# Patient Record
Sex: Male | Born: 1985 | Race: White | Hispanic: No | Marital: Married | Smoking: Former smoker
Health system: Southern US, Community
[De-identification: ages and names within clinical notes are randomized; demographics above are authoritative.]

## PROBLEM LIST (undated history)

## (undated) DIAGNOSIS — S62009A Unspecified fracture of navicular [scaphoid] bone of unspecified wrist, initial encounter for closed fracture: Secondary | ICD-10-CM

## (undated) HISTORY — PX: WISDOM TOOTH EXTRACTION: SHX21

## (undated) HISTORY — PX: DECOMPRESSION FASCIOTOMY LEG: SUR403

---

## 2008-09-11 ENCOUNTER — Encounter: Admission: RE | Admit: 2008-09-11 | Discharge: 2008-09-11 | Payer: Self-pay | Admitting: Orthopedic Surgery

## 2009-05-12 ENCOUNTER — Emergency Department (HOSPITAL_COMMUNITY): Admission: EM | Admit: 2009-05-12 | Discharge: 2009-05-12 | Payer: Self-pay | Admitting: Emergency Medicine

## 2009-05-19 ENCOUNTER — Ambulatory Visit (HOSPITAL_BASED_OUTPATIENT_CLINIC_OR_DEPARTMENT_OTHER): Admission: RE | Admit: 2009-05-19 | Discharge: 2009-05-19 | Payer: Self-pay | Admitting: Orthopedic Surgery

## 2009-05-19 HISTORY — PX: MEDIAN NERVE REPAIR: SHX2024

## 2009-05-19 HISTORY — PX: WOUND DEBRIDEMENT: SHX247

## 2010-09-17 ENCOUNTER — Encounter: Payer: Self-pay | Admitting: Orthopedic Surgery

## 2010-12-01 LAB — TYPE AND SCREEN

## 2011-12-24 ENCOUNTER — Emergency Department (HOSPITAL_COMMUNITY): Payer: PPO

## 2011-12-24 ENCOUNTER — Emergency Department (HOSPITAL_COMMUNITY)
Admission: EM | Admit: 2011-12-24 | Discharge: 2011-12-24 | Disposition: A | Discharge status: Home / Self Care | Payer: PPO | Attending: Emergency Medicine | Admitting: Emergency Medicine

## 2011-12-24 ENCOUNTER — Encounter (HOSPITAL_COMMUNITY): Payer: Self-pay

## 2011-12-24 DIAGNOSIS — Y93B3 Activity, free weights: Secondary | ICD-10-CM | POA: Insufficient documentation

## 2011-12-24 DIAGNOSIS — M5126 Other intervertebral disc displacement, lumbar region: Secondary | ICD-10-CM | POA: Insufficient documentation

## 2011-12-24 DIAGNOSIS — X503XXA Overexertion from repetitive movements, initial encounter: Secondary | ICD-10-CM | POA: Insufficient documentation

## 2011-12-24 MED ORDER — IBUPROFEN 600 MG PO TABS: 600.0000 mg | ORAL_TABLET | Freq: Four times a day (QID) | ORAL | Status: AC | PRN

## 2011-12-24 MED ORDER — MORPHINE SULFATE 4 MG/ML IJ SOLN
4.0000 mg | Freq: Once | INTRAMUSCULAR | Status: AC
  Administered 2011-12-24: 4 mg via INTRAMUSCULAR
  Filled 2011-12-24: qty 1

## 2011-12-24 MED ORDER — OXYCODONE-ACETAMINOPHEN 5-325 MG PO TABS: 1.0000 | ORAL_TABLET | ORAL | Status: AC | PRN

## 2011-12-24 MED ORDER — METHOCARBAMOL 500 MG PO TABS: 500.0000 mg | ORAL_TABLET | Freq: Two times a day (BID) | ORAL | Status: AC

## 2011-12-24 MED ORDER — METHOCARBAMOL 500 MG PO TABS
1000.0000 mg | ORAL_TABLET | Freq: Once | ORAL | Status: AC
  Administered 2011-12-24: 1000 mg via ORAL
  Filled 2011-12-24: qty 2

## 2011-12-24 MED ORDER — KETOROLAC TROMETHAMINE 60 MG/2ML IM SOLN
60.0000 mg | Freq: Once | INTRAMUSCULAR | Status: AC
  Administered 2011-12-24: 60 mg via INTRAMUSCULAR
  Filled 2011-12-24: qty 2

## 2011-12-24 NOTE — ED Notes (Signed)
Pt sts was lifitng weights and felt a pop in back and became light headed. Pt unable to sit still now or in comfortable position.

## 2011-12-24 NOTE — ED Notes (Signed)
Patient reporting pain to right left lower flank after lifting weights 2 hours ago. Patient ambulatory in department but with difficulty.  Patient laying laterally on stretcher. Skin warm, dry and intact, no deformities noted

## 2011-12-24 NOTE — ED Provider Notes (Signed)
History     CSN: [id number]  Arrival date & time 12/24/11  1745   First MD Initiated Contact with Patient 12/24/11 1845      Chief Complaint  Patient presents with  . Back Pain    (Consider location/radiation/quality/duration/timing/severity/associated sxs/prior treatment) HPI Pt with known lumbar disc disease states he was lifting weights when had acute onset of R paraspinal lumbar pain radiating to R buttock. No numbness, weakness, incontinence.  No past medical history on file.  No past surgical history on file.  No family history on file.  History  Substance Use Topics  . Smoking status: Current Everyday Smoker  . Smokeless tobacco: Not on file  . Alcohol Use: No      Review of Systems  Constitutional: Negative for fever and chills.  Musculoskeletal: Positive for back pain.  Skin: Negative for wound.  Neurological: Negative for weakness and numbness.    Allergies  Review of patient's allergies indicates no known allergies.  Home Medications   Current Outpatient Rx  Name Route Sig Dispense Refill  . IBUPROFEN 600 MG PO TABS Oral Take 1 tablet (600 mg total) by mouth every 6 (six) hours as needed for pain. 30 tablet 0  . METHOCARBAMOL 500 MG PO TABS Oral Take 1 tablet (500 mg total) by mouth 2 (two) times daily. 20 tablet 0  . OXYCODONE-ACETAMINOPHEN 5-325 MG PO TABS Oral Take 1 tablet by mouth every 4 (four) hours as needed for pain. 15 tablet 0    BP 112/95  Pulse 95  Temp(Src) 98.6 F (37 C) (Oral)  Resp 18  SpO2 97%  Physical Exam  Nursing note and vitals reviewed. Constitutional: He is oriented to person, place, and time. He appears well-developed and well-nourished. He appears distressed.  HENT:  Head: Normocephalic and atraumatic.  Mouth/Throat: Oropharynx is clear and moist.  Eyes: EOM are normal. Pupils are equal, round, and reactive to light.  Neck: Normal range of motion. Neck supple.  Cardiovascular: Normal rate and regular rhythm.     Pulmonary/Chest: Effort normal and breath sounds normal. No respiratory distress. He has no wheezes. He has no rales.  Abdominal: Soft. Bowel sounds are normal. There is no tenderness. There is no rebound and no guarding.  Musculoskeletal: Normal range of motion. He exhibits tenderness (R paraspinal lumbar ttp. Negative straight leg raise. ). He exhibits no edema.  Neurological: He is alert and oriented to person, place, and time.       5/5 motor, sensation intact. No saddle anesthesia  Skin: Skin is warm and dry. No rash noted. No erythema.       No evidence of trauma  Psychiatric: He has a normal mood and affect. His behavior is normal.    ED Course  Procedures (including critical care time)  Labs Reviewed - No data to display Ct Lumbar Spine Wo Contrast  12/24/2011  *RADIOLOGY REPORT*  Clinical Data: Back injury while lifting weights.  Back pain.  CT LUMBAR SPINE WITHOUT CONTRAST  Technique:  Multidetector CT imaging of the lumbar spine was performed without intravenous contrast administration. Multiplanar CT image reconstructions were also generated.  Comparison: Lumbar spine MRI from 09/01/2008  Findings: No fracture or subluxation is observed.  Disc bulge at L4- 5 and right eccentric degenerative disc disease at L5-S1 are again noted, grossly similar to the prior MRI. There is fullness of the collecting systems bilaterally, but without observed hydroureter. A small lucency in the right iliac bone is probably incidental.  IMPRESSION:  1.  Disc bulge at L4-5 and right eccentric disc bulge or disc protrusion at L5-S1 appear grossly similar to the prior exam. 2.  No fracture or subluxation is observed.  Original Report Authenticated By: Dellia Cloud, M.D.     1. Lumbar herniated disc       MDM   Pt states he is feeling better after meds       Loren Racer, MD 12/24/11 2019

## 2011-12-24 NOTE — ED Notes (Signed)
TRANSPORTED TO CT SCAN.  

## 2011-12-24 NOTE — Discharge Instructions (Signed)
Herniated Disk The bones of your spinal column (vertebrae) protect your spinal cord and nerves that go into your arms and legs. The vertebrae are separated by disks that cushion the spinal column and put space between your vertebrae. This allows movement between the vertebrae, which allows you to bend, rotate, and move your body from side to side. Sometimes, the disks move out of place (herniate) or break open (rupture) from injury or strain. The most common area for a disk herniation is in the lower back (lumbar area). Sometimes herniation occurs in the neck (cervical) disks.  CAUSES  As we grow older, the strong, fibrous cords that connect the vertebrae and support and surround the disks (ligaments) start to weaken. A strain on the back may cause a break in the disk ligaments. RISK FACTORS Herniated disks occur most often in men who are aged 18 years to 35 years, usually after strenuous activity. Other risk factors include conditions present at birth (congenital) that affect the size of the lumbar spinal canal. Additionally, a narrowing of the areas where the nerves exit the spinal canal can occur as you age. SYMPTOMS  Symptoms of a herniated disk vary. You may have weakness in certain muscles. This weakness can include difficulty lifting your leg or arm, difficulty standing on your toes on one side, or difficulty squeezing tightly with one of your hands. You may have numbness. You may feel a mild tingling, dull ache, or a burning or pulsating pain. In some cases, the pain is severe enough that you are unable to move. The pain most often occurs on one side of the body. The pain often starts slowly. It may get worse:  After you sit or stand.   At night.   When you sneeze, cough, or laugh.   When you bend backwards or walk more than a few yards.  The pain, numbness, or weakness will often go away or improve a lot over a period of weeks to months. Herniated lumbar disk Symptoms of a herniated  lumbar disk may include sharp pain in one part of your leg, hip, or buttocks and numbness in other parts. You also may feel pain or numbness on the back of your calf or the top or sole of your foot. The same leg also may feel weak. Herniated cervical disk Symptoms of a herniated cervical disk may include pain when you move your neck, deep pain near or over your shoulder blade, or pain that moves to your upper arm, forearm, or fingers. DIAGNOSIS  To diagnose a herniated disk, your caregiver will perform a physical exam. Your caregiver also may perform diagnostic tests to see your disk or to test the reaction of your muscles and the function of your nerves. During the physical exam, your caregiver may ask you to:  Sit, stand, and walk. While you walk, your caregiver may ask you to try walking on your toes and then your heels.   Bend forward, backward, and sideways.   Raise your shoulders, elbow, wrist, and fingers and check your strength during these tasks.  Your caregiver will check for:  Numbness or loss of feeling.   Muscle reflexes, which may be slower or missing.   Muscle strength, which may be weaker.   Posture or the way your spine curves.  Diagnostic tests that may be done include:  A spinal X-ray exam to rule out other causes of back pain.   Magnetic resonance imaging (MRI) or computed tomography (CT) scan, which will show   if the herniated disk is pressing on your spinal canal.   Electromyography. This is sometimes used to identify the specific area of nerve involvement.  TREATMENT  Initial treatment for a herniated disk is a short period of rest with medicines for pain. Pain medicines can include nonsteroidal anti-inflammatory medicines (NSAIDs), muscle relaxants for back spasms, and (rarely) narcotic pain medicine for severe pain that does not respond to NSAID use. Bed rest is often limited to 1 or 2 days at the most because prolonged rest can delay recovery. When the herniation  involves the lower back, sitting should be avoided as much as possible because sitting increases pressure on the ruptured disk. Sometimes a soft neck collar will be prescribed for a few days to weeks to help support your neck in the case of a cervical herniation. Physical therapy is often prescribed for patients with disk disease. Physical therapists will teach you how to properly lift, dress, walk, and perform other activities. They will work on strengthening the muscles that help support your spine. In some cases, physical therapy alone is not enough to treat a herniated disk. Steroid injections along the involved nerve root may be needed to help control pain. The steroid is injected in the area of the herniated disk and helps by reducing swelling around the disk. Sometimes surgery is the best option to treat a herniated disk.  SEEK IMMEDIATE MEDICAL CARE IF:   You have numbness, tingling, weakness, or problems with the use of your arms or legs.   You have severe headaches that are not relieved with the use of medicines.   You notice a change in your bowel or bladder control.   You have increasing pain in any areas of your body.   You experience shortness of breath, dizziness, or fainting.  MAKE SURE YOU:   Understand these instructions.   Will watch your condition.   Will get help right away if you are not doing well or get worse.  Document Released: 08/10/2000 Document Revised: 08/02/2011 Document Reviewed: 03/16/2011 [employer] Patient Information 2012 [employer]. 

## 2013-07-27 DIAGNOSIS — S62009A Unspecified fracture of navicular [scaphoid] bone of unspecified wrist, initial encounter for closed fracture: Secondary | ICD-10-CM

## 2013-07-27 HISTORY — DX: Unspecified fracture of navicular (scaphoid) bone of unspecified wrist, initial encounter for closed fracture: S62.009A

## 2013-08-18 ENCOUNTER — Other Ambulatory Visit: Payer: Self-pay | Admitting: Orthopedic Surgery

## 2013-08-26 ENCOUNTER — Encounter (HOSPITAL_BASED_OUTPATIENT_CLINIC_OR_DEPARTMENT_OTHER): Payer: Self-pay | Admitting: *Deleted

## 2013-09-02 NOTE — H&P (Signed)
Jason Pittman is an 28 y.o. male.   Chief Complaint: c/o chronic and progressive pain right wrist.  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  We cared for Jason Pittman for a complex high brachial injury in September of 2010 reconstructing his median nerve. He had a tense brachial hematoma from a large caliber axillary vein.   During his care in 2010 we noted a right scaphoid nonunion. We advised him at the time to consider a possible graft and internal fixation. As he had good motion and no significant pain, he elected to postpone treatment at that time. Recently he developed increasing pain in his wrist. [facility] sent him for a consult with Dr. Leonette Mostouhy at [facility], an upper extremity orthopaedic surgeon. Dr. Leonette Mostouhy obtained plain film imaging demonstrating a cystic nonunion that has evolved since 2010 with some mild carpal collapse and mild displacement. There is minimal radial styloid arthrosis but the scaphoid is clearly compacting. Dr. Leonette Mostouhy obtained an [facility] on 07-02-13. 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 He subsequently suggested a CT scan.  Past Medical History  Diagnosis Date  . Scaphoid fracture of wrist 07/2013    right    Past Surgical History  Procedure Laterality Date  . Wisdom tooth extraction    . Wound debridement Left 05/19/2009    left brachial  . Median nerve repair Left 05/19/2009  . Decompression fasciotomy leg Bilateral     as a teenager    History reviewed. No pertinent family history. Social History:  reports that  he has quit smoking. He has never used smokeless tobacco. He reports that he drinks alcohol. He reports that he does not use illicit drugs.  Allergies: No Known Allergies  No prescriptions prior to admission    No results found for this or any previous visit (from the past 48 hour(s)).  No results found.   Pertinent items are noted in HPI.  Height 5\' 9"  (1.753 m), weight 81.647 kg (180 lb).  General appearance: alert Head: Normocephalic, without obvious abnormality Neck: supple, symmetrical, trachea midline Resp: clear to auscultation bilaterally Cardio: regular rate and rhythm GI: normal findings: bowel sounds normal Extremities: . Inspection of his right hand reveals complete recovery of his median innervated intrinsic muscles. He has 5 out of 5 strength in all median innervated muscle groups tested which is remarkable following a high median nerve repair. He still has distant sensibility in his finger tips. We discussed the issues with sensory reinnervation with a high injury.   With respect to his wrist he has ROM dorsiflexion right 75, left 75, palmar flexion right 35 with pain and left 65. He has pain with radial deviation. He has full ROM of his fingers and thumb. His neurovascular exam is intact.  [facility] are reviewed dated 04-23-13. 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 He is in a 40 degree DISI carpal collapse position.  The MRI is reviewed and reveals an  avascular proximal half of the scaphoid with a large cyst. The good news is that he does not have a substantial collapse of his scaphoid despite comminution.  He is in a 40 degree DISI carpal intercalated instability.  The distal portion of the scaphoid is fragmenting. He does have a small cyst within the lunate. He is ulnar neutral. His TFC appears intact.   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  There is some fragmentation of the distal segment of the scaphoid. Based on the MRI appearance of the scaphoid with an apparent avascular proximal half the prognosis for healing of this fracture is extremely uncertain. I've explained to Jason Pittman that in this situation his success or failure will be either 100% or 0%.  In general, the care of scaphoid fractures, as he has, with a large cystic nonunion and avascular proximal pole, have about 50% success rate with bone grafting. Some studies would suggest that vascularized bone grafting would increase the chance of healing however he would likely remain with a very stiff wrist with a vascular pedicle being placed across the dorsum of his carpus and radiocarpal articulation.  It is very important for Jason Pittman to be able to workout in the gym regularly.  Plan: To the OR for right distal radial +/- vascularized bone graft  from radius to scaphoid versus conventional bone grafting with either K-wire or micro Accutrak fixation.The procedure, risks,benefits and post-op course were discussed with the patient at length and they were in agreement with the plan.  DASNOIT,Raye J 09/02/2013, 11:25 AM  H&P documentation: 09/03/2013  -History and Physical Reviewed  -Patient has been re-examined  -No change in the plan of care  Wyn Forster, MD

## 2013-09-03 ENCOUNTER — Ambulatory Visit (HOSPITAL_BASED_OUTPATIENT_CLINIC_OR_DEPARTMENT_OTHER)
Admission: RE | Admit: 2013-09-03 | Discharge: 2013-09-03 | Disposition: A | Discharge status: Home / Self Care | Payer: Managed Care, Other (non HMO) | Source: Ambulatory Visit | Attending: Orthopedic Surgery | Admitting: Orthopedic Surgery

## 2013-09-03 ENCOUNTER — Encounter (HOSPITAL_BASED_OUTPATIENT_CLINIC_OR_DEPARTMENT_OTHER)
Admission: RE | Disposition: A | Discharge status: Home / Self Care | Payer: Self-pay | Source: Ambulatory Visit | Attending: Orthopedic Surgery

## 2013-09-03 ENCOUNTER — Ambulatory Visit (HOSPITAL_BASED_OUTPATIENT_CLINIC_OR_DEPARTMENT_OTHER): Payer: Managed Care, Other (non HMO) | Admitting: Anesthesiology

## 2013-09-03 ENCOUNTER — Encounter (HOSPITAL_BASED_OUTPATIENT_CLINIC_OR_DEPARTMENT_OTHER): Payer: Managed Care, Other (non HMO) | Admitting: Anesthesiology

## 2013-09-03 ENCOUNTER — Encounter (HOSPITAL_BASED_OUTPATIENT_CLINIC_OR_DEPARTMENT_OTHER): Payer: Self-pay

## 2013-09-03 DIAGNOSIS — S42309S Unspecified fracture of shaft of humerus, unspecified arm, sequela: Secondary | ICD-10-CM | POA: Insufficient documentation

## 2013-09-03 DIAGNOSIS — M8708 Idiopathic aseptic necrosis of bone, other site: Secondary | ICD-10-CM | POA: Insufficient documentation

## 2013-09-03 DIAGNOSIS — IMO0002 Reserved for concepts with insufficient information to code with codable children: Secondary | ICD-10-CM | POA: Insufficient documentation

## 2013-09-03 DIAGNOSIS — Z87891 Personal history of nicotine dependence: Secondary | ICD-10-CM | POA: Insufficient documentation

## 2013-09-03 HISTORY — PX: OPEN REDUCTION INTERNAL FIXATION (ORIF) SCAPHOID WITH DISTAL RADIUS GRAFT: SHX5667

## 2013-09-03 HISTORY — DX: Unspecified fracture of navicular (scaphoid) bone of unspecified wrist, initial encounter for closed fracture: S62.009A

## 2013-09-03 LAB — POCT HEMOGLOBIN-HEMACUE: Hemoglobin: 16.2 g/dL (ref 13.0–17.0)

## 2013-09-03 SURGERY — OPEN REDUCTION INTERNAL FIXATION (ORIF) SCAPHOID WITH DISTAL RADIUS GRAFT
Anesthesia: General | Site: Wrist | Laterality: Right

## 2013-09-03 MED ORDER — FENTANYL CITRATE 0.05 MG/ML IJ SOLN
INTRAMUSCULAR | Status: AC
  Filled 2013-09-03: qty 2

## 2013-09-03 MED ORDER — DEXAMETHASONE SODIUM PHOSPHATE 4 MG/ML IJ SOLN
INTRAMUSCULAR | Status: DC | PRN
  Administered 2013-09-03: 10 mg via INTRAVENOUS

## 2013-09-03 MED ORDER — LIDOCAINE HCL (CARDIAC) 20 MG/ML IV SOLN
INTRAVENOUS | Status: DC | PRN
  Administered 2013-09-03: 80 mg via INTRAVENOUS

## 2013-09-03 MED ORDER — PROPOFOL 10 MG/ML IV BOLUS
INTRAVENOUS | Status: DC | PRN
  Administered 2013-09-03: 200 mg via INTRAVENOUS

## 2013-09-03 MED ORDER — HYDROMORPHONE HCL 2 MG PO TABS: ORAL_TABLET | ORAL | Status: DC

## 2013-09-03 MED ORDER — MIDAZOLAM HCL 2 MG/2ML IJ SOLN
INTRAMUSCULAR | Status: AC
  Filled 2013-09-03: qty 2

## 2013-09-03 MED ORDER — ROPIVACAINE HCL 5 MG/ML IJ SOLN
INTRAMUSCULAR | Status: DC | PRN
  Administered 2013-09-03: 30 mL via PERINEURAL

## 2013-09-03 MED ORDER — MIDAZOLAM HCL 5 MG/5ML IJ SOLN
INTRAMUSCULAR | Status: DC | PRN
  Administered 2013-09-03: 2 mg via INTRAVENOUS

## 2013-09-03 MED ORDER — PROMETHAZINE HCL 25 MG/ML IJ SOLN
6.2500 mg | INTRAMUSCULAR | Status: DC | PRN
  Administered 2013-09-03: 6.25 mg via INTRAVENOUS
  Filled 2013-09-03: qty 1

## 2013-09-03 MED ORDER — CHLORHEXIDINE GLUCONATE 4 % EX LIQD: 60.0000 mL | Freq: Once | CUTANEOUS | Status: DC

## 2013-09-03 MED ORDER — HYDROMORPHONE HCL PF 1 MG/ML IJ SOLN: 0.2500 mg | INTRAMUSCULAR | Status: DC | PRN

## 2013-09-03 MED ORDER — MIDAZOLAM HCL 2 MG/2ML IJ SOLN
1.0000 mg | INTRAMUSCULAR | Status: DC | PRN
  Administered 2013-09-03: 2 mg via INTRAVENOUS

## 2013-09-03 MED ORDER — MIDAZOLAM HCL 2 MG/ML PO SYRP: 12.0000 mg | ORAL_SOLUTION | Freq: Once | ORAL | Status: DC | PRN

## 2013-09-03 MED ORDER — FENTANYL CITRATE 0.05 MG/ML IJ SOLN
50.0000 ug | INTRAMUSCULAR | Status: DC | PRN
  Administered 2013-09-03: 100 ug via INTRAVENOUS

## 2013-09-03 MED ORDER — CEPHALEXIN 500 MG PO CAPS: 500.0000 mg | ORAL_CAPSULE | Freq: Three times a day (TID) | ORAL | Status: DC

## 2013-09-03 MED ORDER — LACTATED RINGERS IV SOLN
INTRAVENOUS | Status: DC
  Administered 2013-09-03: 12:00:00 via INTRAVENOUS
  Administered 2013-09-03: 20 mL/h via INTRAVENOUS
  Administered 2013-09-03: 14:00:00 via INTRAVENOUS

## 2013-09-03 MED ORDER — OXYCODONE HCL 5 MG PO TABS: 5.0000 mg | ORAL_TABLET | Freq: Once | ORAL | Status: DC | PRN

## 2013-09-03 MED ORDER — OXYCODONE HCL 5 MG/5ML PO SOLN: 5.0000 mg | Freq: Once | ORAL | Status: DC | PRN

## 2013-09-03 MED ORDER — ONDANSETRON HCL 4 MG/2ML IJ SOLN
INTRAMUSCULAR | Status: DC | PRN
  Administered 2013-09-03: 4 mg via INTRAVENOUS

## 2013-09-03 SURGICAL SUPPLY — 66 items
BAG DECANTER FOR FLEXI CONT (MISCELLANEOUS) IMPLANT
BANDAGE ADH SHEER 1  50/CT (GAUZE/BANDAGES/DRESSINGS) IMPLANT
BANDAGE ELASTIC 3 VELCRO ST LF (GAUZE/BANDAGES/DRESSINGS) IMPLANT
BLADE AVERAGE 25MMX9MM (BLADE)
BLADE AVERAGE 25X9 (BLADE) IMPLANT
BLADE MINI RND TIP GREEN BEAV (BLADE) ×3 IMPLANT
BLADE SURG 15 STRL LF DISP TIS (BLADE) ×1 IMPLANT
BLADE SURG 15 STRL SS (BLADE) ×2
BNDG COHESIVE 3X5 TAN STRL LF (GAUZE/BANDAGES/DRESSINGS) IMPLANT
BNDG ESMARK 4X9 LF (GAUZE/BANDAGES/DRESSINGS) ×3 IMPLANT
BNDG GAUZE ELAST 4 BULKY (GAUZE/BANDAGES/DRESSINGS) ×3 IMPLANT
BONE CHIP PRESERV 5CC PCAN5 (Bone Implant) ×3 IMPLANT
BRUSH SCRUB EZ PLAIN DRY (MISCELLANEOUS) ×3 IMPLANT
CANISTER SUCT 1200ML W/VALVE (MISCELLANEOUS) ×3 IMPLANT
CLOSURE WOUND 1/2 X4 (GAUZE/BANDAGES/DRESSINGS) ×1
CORDS BIPOLAR (ELECTRODE) ×3 IMPLANT
COVER MAYO STAND STRL (DRAPES) ×3 IMPLANT
COVER TABLE BACK 60X90 (DRAPES) ×3 IMPLANT
CUFF TOURNIQUET SINGLE 18IN (TOURNIQUET CUFF) ×3 IMPLANT
DECANTER SPIKE VIAL GLASS SM (MISCELLANEOUS) IMPLANT
DRAPE EXTREMITY T 121X128X90 (DRAPE) ×3 IMPLANT
DRAPE OEC MINIVIEW 54X84 (DRAPES) ×3 IMPLANT
DRAPE SURG 17X23 STRL (DRAPES) ×3 IMPLANT
GAUZE XEROFORM 1X8 LF (GAUZE/BANDAGES/DRESSINGS) IMPLANT
GLOVE BIO SURGEON STRL SZ7 (GLOVE) ×6 IMPLANT
GLOVE BIOGEL M STRL SZ7.5 (GLOVE) ×3 IMPLANT
GLOVE BIOGEL PI IND STRL 7.5 (GLOVE) ×1 IMPLANT
GLOVE BIOGEL PI INDICATOR 7.5 (GLOVE) ×2
GLOVE EXAM NITRILE EXT CFF LRG (GLOVE) ×3 IMPLANT
GLOVE ORTHO TXT STRL SZ7.5 (GLOVE) ×3 IMPLANT
GOWN STRL REUS W/ TWL LRG LVL3 (GOWN DISPOSABLE) ×1 IMPLANT
GOWN STRL REUS W/TWL LRG LVL3 (GOWN DISPOSABLE) ×2
GOWN STRL REUS W/TWL XL LVL3 (GOWN DISPOSABLE) ×6 IMPLANT
K-WIRE .045X4 (WIRE) ×3 IMPLANT
LOOP VESSEL MAXI BLUE (MISCELLANEOUS) IMPLANT
NEEDLE 27GAX1X1/2 (NEEDLE) IMPLANT
NS IRRIG 1000ML POUR BTL (IV SOLUTION) ×3 IMPLANT
PACK BASIN DAY SURGERY FS (CUSTOM PROCEDURE TRAY) ×3 IMPLANT
PAD CAST 3X4 CTTN HI CHSV (CAST SUPPLIES) ×2 IMPLANT
PADDING CAST ABS 4INX4YD NS (CAST SUPPLIES) ×2
PADDING CAST ABS COTTON 4X4 ST (CAST SUPPLIES) ×1 IMPLANT
PADDING CAST COTTON 3X4 STRL (CAST SUPPLIES) ×4
SLEEVE SCD COMPRESS KNEE MED (MISCELLANEOUS) ×3 IMPLANT
SPLINT PLASTER CAST XFAST 3X15 (CAST SUPPLIES) ×14 IMPLANT
SPLINT PLASTER XTRA FASTSET 3X (CAST SUPPLIES) ×28
SPONGE GAUZE 4X4 12PLY (GAUZE/BANDAGES/DRESSINGS) ×3 IMPLANT
STOCKINETTE 4X48 STRL (DRAPES) ×3 IMPLANT
STRIP CLOSURE SKIN 1/2X4 (GAUZE/BANDAGES/DRESSINGS) ×2 IMPLANT
SUCTION FRAZIER TIP 10 FR DISP (SUCTIONS) IMPLANT
SUT ETHIBOND 3-0 V-5 (SUTURE) ×3 IMPLANT
SUT PROLENE 3 0 PS 2 (SUTURE) ×3 IMPLANT
SUT SILK 4 0 PS 2 (SUTURE) ×3 IMPLANT
SUT VIC AB 0 SH 27 (SUTURE) IMPLANT
SUT VIC AB 2-0 PS2 27 (SUTURE) IMPLANT
SUT VIC AB 3-0 FS2 27 (SUTURE) IMPLANT
SUT VIC AB 4-0 BRD 54 (SUTURE) IMPLANT
SUT VIC AB 4-0 P-3 18XBRD (SUTURE) ×1 IMPLANT
SUT VIC AB 4-0 P3 18 (SUTURE) ×2
SYR 3ML 23GX1 SAFETY (SYRINGE) IMPLANT
SYR BULB 3OZ (MISCELLANEOUS) ×3 IMPLANT
SYR CONTROL 10ML LL (SYRINGE) IMPLANT
TOWEL OR 17X24 6PK STRL BLUE (TOWEL DISPOSABLE) ×3 IMPLANT
TRAY DSU PREP LF (CUSTOM PROCEDURE TRAY) ×3 IMPLANT
TUBE CONNECTING 20'X1/4 (TUBING) ×1
TUBE CONNECTING 20X1/4 (TUBING) ×2 IMPLANT
UNDERPAD 30X30 INCONTINENT (UNDERPADS AND DIAPERS) IMPLANT

## 2013-09-03 NOTE — Anesthesia Procedure Notes (Addendum)
Anesthesia Regional Block:  Supraclavicular block  Pre-Anesthetic Checklist: ,, timeout performed, Correct Patient, Correct Site, Correct Laterality, Correct Procedure, Correct Position, site marked, Risks and benefits discussed,  Surgical consent,  Pre-op evaluation,  At surgeon's request and post-op pain management  Laterality: Right and Upper  Prep: chloraprep       Needles:   Needle Type: Echogenic Needle      Needle Gauge: 21 and 21 G  Needle insertion depth: 5 cm   Additional Needles:  Procedures: ultrasound guided (picture in chart) and nerve stimulator Supraclavicular block Narrative:  Start time: 09/03/2013 1:10 PM End time: 09/03/2013 1:24 PM Injection made incrementally with aspirations every 5 mL.  Performed by: Personally  Anesthesiologist: T Massagee  Additional Notes: monitors applied, sedation begun, tolerated well   Procedure Name: LMA Insertion Date/Time: 09/03/2013 2:03 PM Performed by: Gar GibbonKEETON, Peyson Postema S Pre-anesthesia Checklist: Patient identified, Emergency Drugs available, Suction available and Patient being monitored Patient Re-evaluated:Patient Re-evaluated prior to inductionOxygen Delivery Method: Circle System Utilized Preoxygenation: Pre-oxygenation with 100% oxygen Intubation Type: IV induction Ventilation: Mask ventilation without difficulty LMA: LMA inserted LMA Size: 4.0 Number of attempts: 1 Airway Equipment and Method: bite block Placement Confirmation: positive ETCO2 Tube secured with: Tape Dental Injury: Teeth and Oropharynx as per pre-operative assessment

## 2013-09-03 NOTE — Op Note (Signed)
[id number] 

## 2013-09-03 NOTE — Discharge Instructions (Addendum)
[facility] Center Instructions Hand Surgery  Wound Care: Keep your hand elevated above the level of your heart.  Do not allow it to dangle by your side.  Keep the dressing dry and do not remove it unless your doctor advises you to do so.  He will usually change it at the time of your post-op visit.  Moving your fingers is advised to stimulate circulation but will depend on the site of your surgery.  If you have a splint applied, your doctor will advise you regarding movement.  Activity: Do not drive or operate machinery today.  Rest today and then you may return to your normal activity and work as indicated by your physician.  Diet:  Drink liquids today or eat a light diet.  You may resume a regular diet tomorrow.    General expectations: Pain for two to three days. Fingers may become slightly swollen.  Call your doctor if any of the following occur: Severe pain not relieved by pain medication. Elevated temperature. Dressing soaked with blood. Inability to move fingers. White or bluish color to fingers.  elevate right hand for 4 days  Do range of motion exercises extending fingers and making a fist posture. Soreness with range of motion is expected.   Post Anesthesia Home Care Instructions  Activity: Get plenty of rest for the remainder of the day. A responsible adult should stay with you for 24 hours following the procedure.  For the next 24 hours, DO NOT: -Drive a car -Advertising copywriterperate machinery -Drink alcoholic beverages -Take any medication unless instructed by your physician -Make any legal decisions or sign important papers.  Meals: Start with liquid foods such as gelatin or soup. Progress to regular foods as tolerated. Avoid greasy, spicy, heavy foods. If nausea and/or vomiting occur, drink only clear liquids until the nausea and/or vomiting subsides. Call your physician if vomiting continues.  Special Instructions/Symptoms: Your throat may feel dry or sore from the anesthesia or  the breathing tube placed in your throat during surgery. If this causes discomfort, gargle with warm salt water. The discomfort should disappear within 24 hours.   Regional Anesthesia Blocks  1. Numbness or the inability to move the "blocked" extremity may last from 3-48 hours after placement. The length of time depends on the medication injected and your individual response to the medication. If the numbness is not going away after 48 hours, call your surgeon.  2. The extremity that is blocked will need to be protected until the numbness is gone and the  Strength has returned. Because you cannot feel it, you will need to take extra care to avoid injury. Because it may be weak, you may have difficulty moving it or using it. You may not know what position it is in without looking at it while the block is in effect.  3. For blocks in the legs and feet, returning to weight bearing and walking needs to be done carefully. You will need to wait until the numbness is entirely gone and the strength has returned. You should be able to move your leg and foot normally before you try and bear weight or walk. You will need someone to be with you when you first try to ensure you do not fall and possibly risk injury.  4. Bruising and tenderness at the needle site are common side effects and will resolve in a few days.  5. Persistent numbness or new problems with movement should be communicated to the surgeon or the [facility] [phone] [place] [phone]).

## 2013-09-03 NOTE — Anesthesia Preprocedure Evaluation (Signed)
Anesthesia Evaluation  Patient identified by MRN, date of birth, ID band Patient awake    Reviewed: Allergy & Precautions, H&P , NPO status , Patient's Chart, lab work & pertinent test results  History of Anesthesia Complications Negative for: history of anesthetic complications  Airway Mallampati: I  Neck ROM: full    Dental no notable dental hx. (+) Teeth Intact   Pulmonary neg pulmonary ROS, former smoker,  breath sounds clear to auscultation  Pulmonary exam normal       Cardiovascular negative cardio ROS  IRhythm:regular Rate:Normal     Neuro/Psych negative neurological ROS  negative psych ROS   GI/Hepatic negative GI ROS, Neg liver ROS,   Endo/Other  negative endocrine ROS  Renal/GU negative Renal ROS  negative genitourinary   Musculoskeletal   Abdominal   Peds  Hematology negative hematology ROS (+)   Anesthesia Other Findings   Reproductive/Obstetrics negative OB ROS                           Anesthesia Physical Anesthesia Plan  ASA: I  Anesthesia Plan: General and General LMA   Post-op Pain Management: MAC Combined w/ Regional for Post-op pain   Induction:   Airway Management Planned:   Additional Equipment:   Intra-op Plan:   Post-operative Plan:   Informed Consent: I have reviewed the patients History and Physical, chart, labs and discussed the procedure including the risks, benefits and alternatives for the proposed anesthesia with the patient or authorized representative who has indicated his/her understanding and acceptance.     Plan Discussed with: CRNA and Surgeon  Anesthesia Plan Comments:         Anesthesia Quick Evaluation

## 2013-09-03 NOTE — Progress Notes (Signed)
Assisted Dr. Massagee with right, ultrasound guided, supraclavicular block. Side rails up, monitors on throughout procedure. See vital signs in flow sheet. Tolerated Procedure well. 

## 2013-09-03 NOTE — Transfer of Care (Signed)
Immediate Anesthesia Transfer of Care Note  Patient: Jason ParkerRobert Quirino  Procedure(s) Performed: Procedure(s): Autologenous Distal Radius bone graft to scaphoid,  (Right)  Patient Location: PACU  Anesthesia Type:General  Level of Consciousness: awake and sedated  Airway & Oxygen Therapy: Patient Spontanous Breathing and Patient connected to face mask oxygen  Post-op Assessment: Report given to PACU RN and Post -op Vital signs reviewed and stable  Post vital signs: Reviewed and stable  Complications: No apparent anesthesia complications

## 2013-09-03 NOTE — Brief Op Note (Signed)
09/03/2013  4:00 PM  PATIENT:  Jason Pittman  28 y.o. 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  SURGEON:  Surgeon(s) and Role:    * Wyn Forsterobert V Jaloni Sorber Jr., MD - Primary  PHYSICIAN ASSISTANT:   ASSISTANTS: Mallory Shirkobert Dasnoit,P.A-C    ANESTHESIA:   general  EBL:  Total I/O In: 1000 [I.V.:1000] Out: -   BLOOD ADMINISTERED:none  DRAINS: none   LOCAL MEDICATIONS USED:  ROPIVACAINE PLEXUS BLOCK  SPECIMEN:  No Specimen  DISPOSITION OF SPECIMEN:  N/A  COUNTS:  YES  TOURNIQUET:   Total Tourniquet Time Documented: Upper Arm (Right) - 96 minutes Total: Upper Arm (Right) - 96 minutes   DICTATION: .Other Dictation: Dictation Number [id number]  PLAN OF CARE: Discharge to home after PACU  PATIENT DISPOSITION:  PACU - hemodynamically stable.   Delay start of Pharmacological VTE agent (>24hrs) due to surgical blood loss or risk of bleeding: not applicable

## 2013-09-03 NOTE — Anesthesia Postprocedure Evaluation (Signed)
  Anesthesia Post-op Note  Patient: Jason ParkerRobert Pusey  Procedure(s) Performed: Procedure(s): Autologenous Distal Radius bone graft to scaphoid,  (Right)  Patient Location: PACU  Anesthesia Type:GA combined with regional for post-op pain  Level of Consciousness: awake and oriented  Airway and Oxygen Therapy: Patient Spontanous Breathing  Post-op Pain: none  Post-op Assessment: Post-op Vital signs reviewed, NAUSEA AND VOMITING PRESENT and Pain level controlled  Post-op Vital Signs: stable  Complications: No apparent anesthesia complications

## 2013-09-04 ENCOUNTER — Encounter (HOSPITAL_BASED_OUTPATIENT_CLINIC_OR_DEPARTMENT_OTHER): Payer: Self-pay | Admitting: Orthopedic Surgery
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
## 2013-11-09 ENCOUNTER — Other Ambulatory Visit: Payer: Self-pay | Admitting: Orthopedic Surgery

## 2013-11-09 DIAGNOSIS — IMO0002 Reserved for concepts with insufficient information to code with codable children: Secondary | ICD-10-CM

## 2013-11-30 ENCOUNTER — Ambulatory Visit
Admission: RE | Admit: 2013-11-30 | Discharge: 2013-11-30 | Disposition: A | Discharge status: Home / Self Care | Payer: Managed Care, Other (non HMO) | Source: Ambulatory Visit | Attending: Orthopedic Surgery | Admitting: Orthopedic Surgery

## 2013-11-30 DIAGNOSIS — IMO0002 Reserved for concepts with insufficient information to code with codable children: Secondary | ICD-10-CM
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
## 2017-08-23 DIAGNOSIS — F900 Attention-deficit hyperactivity disorder, predominantly inattentive type: Secondary | ICD-10-CM | POA: Diagnosis not present

## 2018-02-21 DIAGNOSIS — F900 Attention-deficit hyperactivity disorder, predominantly inattentive type: Secondary | ICD-10-CM | POA: Diagnosis not present

## 2018-03-25 DIAGNOSIS — F900 Attention-deficit hyperactivity disorder, predominantly inattentive type: Secondary | ICD-10-CM | POA: Diagnosis not present

## 2018-04-03 DIAGNOSIS — F900 Attention-deficit hyperactivity disorder, predominantly inattentive type: Secondary | ICD-10-CM | POA: Diagnosis not present

## 2018-04-14 DIAGNOSIS — F900 Attention-deficit hyperactivity disorder, predominantly inattentive type: Secondary | ICD-10-CM | POA: Diagnosis not present

## 2018-05-27 ENCOUNTER — Telehealth: Payer: Self-pay | Admitting: Physician Assistant

## 2018-05-27 NOTE — Telephone Encounter (Signed)
NEEDS REFILL VYVANSE.  HAS APPT 08/08/18.  CALL WHEN READY.  WILL PU

## 2018-05-28 NOTE — Telephone Encounter (Signed)
Written Rx given to nurse

## 2018-05-28 NOTE — Telephone Encounter (Signed)
Written rx given to provider to sign.   Teresa chart and rx placed in your box.  Return to me.

## 2018-06-18 ENCOUNTER — Ambulatory Visit (INDEPENDENT_AMBULATORY_CARE_PROVIDER_SITE_OTHER): Payer: BLUE CROSS/BLUE SHIELD | Admitting: Psychiatry

## 2018-06-18 DIAGNOSIS — F4321 Adjustment disorder with depressed mood: Secondary | ICD-10-CM
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
## 2018-07-02 ENCOUNTER — Ambulatory Visit (INDEPENDENT_AMBULATORY_CARE_PROVIDER_SITE_OTHER): Payer: BLUE CROSS/BLUE SHIELD | Admitting: Psychiatry

## 2018-07-02 DIAGNOSIS — F4321 Adjustment disorder with depressed mood: Secondary | ICD-10-CM
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
## 2018-07-09 ENCOUNTER — Ambulatory Visit: Payer: BLUE CROSS/BLUE SHIELD | Admitting: Psychiatry

## 2018-07-16 ENCOUNTER — Ambulatory Visit (INDEPENDENT_AMBULATORY_CARE_PROVIDER_SITE_OTHER): Payer: BLUE CROSS/BLUE SHIELD | Admitting: Psychiatry

## 2018-07-16 ENCOUNTER — Encounter: Payer: Self-pay | Admitting: Psychiatry

## 2018-07-16 DIAGNOSIS — F4321 Adjustment disorder with depressed mood: Secondary | ICD-10-CM

## 2018-07-16 NOTE — Progress Notes (Signed)
      [facility] Counselor/Therapist Progress Note   Patient ID: Jason ParkerRobert Pittman, MRN: [id number]  Date: 07/16/2018   Timespent: 57 minutes    Treatment Type: Individual   Reported Symptoms: Sadness   Mental Status Exam:    Appearance:   Well Groomed     Behavior:  Appropriate  Motor:  Normal  Speech/Language:   Clear and Coherent  Affect:  Appropriate  Mood:  anxious, irritable and sad  Thought process:  normal  Thought content:    WNL  Sensory/Perceptual disturbances:    WNL  Orientation:  oriented to person, place, time/date and situation  Attention:  Good  Concentration:  Good  Memory:  WNL  Fund of knowledge:   Good  Insight:    Good  Judgment:   Good  Impulse Control:  Good     Risk Assessment: Danger to Self:  No Self-injurious Behavior: No Danger to Others: No Duty to Warn:no Physical Aggression / Violence:No  Access to Firearms a concern: No  Gang Involvement:No    Subjective: The client reports he feels so much better since he has talked with his dad.  The disruption in their relationship caused by his mother has now been healed.  His dad had texted him the name of a book he was reading that is about narcissism.  The client has started reading this as well.  We discussed the clients relationship with his mother.  He agrees that she displays many narcissistic traits.  He understands that he will have to set the boundaries which Pang Robers make her angry but he will still pursue her expressing his love.  He states, "will I have to be the parent?"  In many ways he will take more of a parental role as far as setting boundaries and saying what is appropriate.  But he is not to be her dad.  He is already a father to his own children but that role will inform how he can interact with his mom.  In some ways this made the client very sad and tearful during the course of the session.  I used eye movement desensitization and reprocessing to reduce the level of emotional  content the client was feeling.  His level of distress went from a suds equals 7 to less than 2 at the end of the session.  We discussed more of the ins and outs of being assertive and setting appropriate boundaries while still being loving.  This is the client's goal.  He will read some books and more articles on boundaries and also dialogue with his wife.  He finds all that very helpful.  At next session we will discuss the symptoms the client still experiences as a result of the PTSD from his military service.   Interventions: Assertiveness/Communication, Solution-Oriented/Positive Psychology, Eye Movement Desensitization and Reprocessing (EMDR) and Insight-Oriented   Diagnosis:   ICD-10-CM   1. Adjustment disorder with depressed mood F43.21      Plan: Bibliotherapy, self care, boundaries, assertiveness.   Gelene MinkFrederick Blase Beckner, [place]

## 2018-08-08 ENCOUNTER — Encounter: Payer: Self-pay | Admitting: Physician Assistant

## 2018-08-08 ENCOUNTER — Ambulatory Visit: Payer: BLUE CROSS/BLUE SHIELD | Admitting: Physician Assistant

## 2018-08-08 VITALS — BP 123/86 | HR 105

## 2018-08-08 DIAGNOSIS — F902 Attention-deficit hyperactivity disorder, combined type: Secondary | ICD-10-CM | POA: Diagnosis not present

## 2018-08-08 MED ORDER — LISDEXAMFETAMINE DIMESYLATE 70 MG PO CAPS: 70.0000 mg | ORAL_CAPSULE | Freq: Every day | ORAL | 0 refills | Status: DC

## 2018-08-08 NOTE — Progress Notes (Signed)
[facility] Med Check  Patient ID: Jason ParkerRobert Pittman,  MRN: [id number]  PCP: System, Provider Not In  Date of Evaluation: 08/08/2018 Time spent:15 minutes  Chief Complaint:  Chief Complaint    Follow-up; ADHD      HISTORY/CURRENT STATUS: HPI here for 4273-month med check.  States he is doing well.States that attention is good without easy distractibility.  Able to focus on things and finish tasks to completion.  Work is going well.  He has a new job and likes it.  He still doing the same thing in IT recruiting, just at a different place.  Individual Medical History/ Review of Systems: Changes? :No   Allergies: Patient has no known allergies.  Current Medications:  Current Outpatient Medications:  .  ibuprofen (ADVIL,MOTRIN) 200 MG tablet, Take 200 mg by mouth every 6 (six) hours as needed., Disp: , Rfl:  .  lisdexamfetamine (VYVANSE) 70 MG capsule, Take 70 mg by mouth daily., Disp: , Rfl:  .  Multiple Vitamin (MULTIVITAMIN) tablet, Take 1 tablet by mouth daily., Disp: , Rfl:  .  [START ON 10/21/2018] lisdexamfetamine (VYVANSE) 70 MG capsule, Take 1 capsule (70 mg total) by mouth daily., Disp: 90 capsule, Rfl: 0 Medication Side Effects: none  Family Medical/ Social History: Changes? 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  General Appearance: Casual and Well Groomed  Eye Contact:  Good  Speech:  Clear and Coherent  Volume:  Normal  Mood:  Euthymic  Affect:  Appropriate  Thought Process:  Goal Directed  Orientation:  Full (Time, Place, and Person)  Thought Content: Logical   Suicidal Thoughts:  No  Homicidal Thoughts:  No  Memory:  WNL  Judgement:  Good  Insight:  Good  Psychomotor Activity:  Normal  Concentration:  Concentration: Good and Attention Span: Good  Recall:  Good  Fund of Knowledge: Good  Language: Good  Assets:  Desire  for Improvement  ADL's:  Intact  Cognition: WNL  Prognosis:  Good    DIAGNOSES:    ICD-10-CM   1. Attention deficit hyperactivity disorder (ADHD), combined type F90.2     Receiving Psychotherapy: Yes With Fred May LPC   RECOMMENDATIONS: Continue Vyvanse 70 mg. Continue psychotherapy with Merlyn AlbertFred May LPC Return in 6 months or sooner as needed.   Melony Overlyeresa Siyana Erney, PA-C

## 2018-09-15 ENCOUNTER — Ambulatory Visit: Payer: BLUE CROSS/BLUE SHIELD | Admitting: Psychiatry

## 2018-10-02 DIAGNOSIS — M25531 Pain in right wrist: Secondary | ICD-10-CM | POA: Diagnosis not present

## 2018-10-02 DIAGNOSIS — M545 Low back pain: Secondary | ICD-10-CM | POA: Diagnosis not present

## 2018-10-02 DIAGNOSIS — F4310 Post-traumatic stress disorder, unspecified: Secondary | ICD-10-CM | POA: Diagnosis not present

## 2018-10-02 DIAGNOSIS — Z23 Encounter for immunization: Secondary | ICD-10-CM | POA: Diagnosis not present

## 2018-10-02 DIAGNOSIS — F321 Major depressive disorder, single episode, moderate: Secondary | ICD-10-CM | POA: Diagnosis not present

## 2018-10-08 ENCOUNTER — Encounter: Payer: Self-pay | Admitting: Emergency Medicine

## 2018-10-08 DIAGNOSIS — F900 Attention-deficit hyperactivity disorder, predominantly inattentive type: Secondary | ICD-10-CM

## 2018-10-09 ENCOUNTER — Encounter: Payer: Self-pay | Admitting: Psychiatry

## 2018-10-09 ENCOUNTER — Ambulatory Visit: Payer: BLUE CROSS/BLUE SHIELD | Admitting: Psychiatry

## 2018-10-09 DIAGNOSIS — F4321 Adjustment disorder with depressed mood: Secondary | ICD-10-CM | POA: Diagnosis not present

## 2018-10-09 DIAGNOSIS — F4310 Post-traumatic stress disorder, unspecified: Secondary | ICD-10-CM

## 2018-10-09 DIAGNOSIS — F902 Attention-deficit hyperactivity disorder, combined type: Secondary | ICD-10-CM
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
## 2018-10-20 ENCOUNTER — Telehealth: Payer: Self-pay | Admitting: Physician Assistant

## 2018-10-20 NOTE — Telephone Encounter (Signed)
Pt called need refill Vyvanse 70mg  #90 [facility]  [address]. Have 3 days left.

## 2018-10-21 NOTE — Telephone Encounter (Signed)
Pt requesting refill Vyvanse, but provider submitted that already and it's on hold at pharmacy

## 2018-12-03 ENCOUNTER — Ambulatory Visit (INDEPENDENT_AMBULATORY_CARE_PROVIDER_SITE_OTHER): Payer: BLUE CROSS/BLUE SHIELD | Admitting: Psychiatry

## 2018-12-03 ENCOUNTER — Encounter: Payer: Self-pay | Admitting: Psychiatry

## 2018-12-03 ENCOUNTER — Other Ambulatory Visit: Payer: Self-pay

## 2018-12-03 DIAGNOSIS — F4321 Adjustment disorder with depressed mood: Secondary | ICD-10-CM | POA: Diagnosis not present
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
## 2018-12-17 ENCOUNTER — Encounter: Payer: Self-pay | Admitting: Psychiatry

## 2018-12-17 ENCOUNTER — Ambulatory Visit (INDEPENDENT_AMBULATORY_CARE_PROVIDER_SITE_OTHER): Payer: BLUE CROSS/BLUE SHIELD | Admitting: Psychiatry

## 2018-12-17 ENCOUNTER — Other Ambulatory Visit: Payer: Self-pay

## 2018-12-17 DIAGNOSIS — F4321 Adjustment disorder with depressed mood: Secondary | ICD-10-CM | POA: Diagnosis not present

## 2018-12-17 NOTE — Progress Notes (Signed)
[facility] Counselor/Therapist Progress Note  Patient ID: Jason Pittman, MRN: [id number],    Date: 12/17/2018  Time Spent: 52 minutes   Treatment Type: Individual Therapy  Reported Symptoms: anxiety, sadness, irritable  Mental Status Exam:  Appearance:   Casual and Well Groomed     Behavior:  Appropriate  Motor:  Normal  Speech/Language:   Clear and Coherent  Affect:  Appropriate  Mood:  normal  Thought process:  normal  Thought content:    WNL  Sensory/Perceptual disturbances:    WNL  Orientation:  oriented to person, place, time/date and situation  Attention:  Good  Concentration:  Good  Memory:  WNL  Fund of knowledge:   Good  Insight:    Good  Judgment:   Good  Impulse Control:  Good   Risk Assessment: Danger to Self:  No Self-injurious Behavior: No Danger to Others: No Duty to Warn:no Physical Aggression / Violence:No  Access to Firearms a concern: No  Gang Involvement:No   Subjective: I connected with patient by a video enabled telemedicine application or telephone, with their informed consent, and verified patient privacy and that I am speaking with the correct person using two identifiers.  I was located at [facility] and patient at home. The client states that he and his wife are doing well.  He is now 7 weeks working remote from his office.  The client had discussed with his wife about his relationship with his mom.  He was able to lay out his boundaries with what he needs.  His wife was helpful in fine-tuning those goals.  He understands that at some point he will need to set a big boundary with his mom which he feels is going to be very disappointing.  He notes that his relationship with his dad is much, much better than it has ever been. Over the holidays the client brought his wife a [place] type bike.  He states he is frustrated that she has used it a handful of times.  It was an expensive bike and it makes him irritated to see it  collecting dust.  He wants to be able to approach his wife about this and not ruin their relationship.  As we discussed it, the client came to some clear points that he wanted to make.  He knew that she had made reference to not having the time to use the machine.  He states he will take responsibility that he has not helped her with that and work towards guarding her time so she can do that.  He also wants to make the point that will be good for her mental health as well as for her physical health and improving her sleep.  He also wants to make sure that this becomes part of their lifestyle as a family.  The client feels very good about approaching her this way and will try to write things out first.   Interventions: Assertiveness/Communication, Solution-Oriented/Positive Psychology and Insight-Oriented  Diagnosis:   ICD-10-CM   1. Adjustment disorder with depressed mood F43.21     Plan: Boundaries with mom, leadership and his family, assertive communication with wife, boundaries.  This record has been created using [employer].  Chart creation errors have been sought, but Nhyla Nappi not always have been located and corrected. Such creation errors do not reflect on the standard of medical care.  Gelene Mink Skilynn Durney, [facility]

## 2019-01-05 ENCOUNTER — Encounter: Payer: Self-pay | Admitting: Psychiatry

## 2019-01-05 ENCOUNTER — Other Ambulatory Visit: Payer: Self-pay

## 2019-01-05 ENCOUNTER — Ambulatory Visit (INDEPENDENT_AMBULATORY_CARE_PROVIDER_SITE_OTHER): Payer: BLUE CROSS/BLUE SHIELD | Admitting: Psychiatry

## 2019-01-05 DIAGNOSIS — F4321 Adjustment disorder with depressed mood: Secondary | ICD-10-CM

## 2019-01-05 NOTE — Progress Notes (Signed)
[facility] Counselor/Therapist Progress Note  Patient ID: Jason Pittman, MRN: [id number],    Date: 01/05/2019  Time Spent: 52 minutes   Treatment Type: Individual Therapy  Reported Symptoms: sad, stress  Mental Status Exam:  Appearance:   Casual and Well Groomed     Behavior:  Appropriate  Motor:  Normal  Speech/Language:   Clear and Coherent  Affect:  Appropriate  Mood:  sad  Thought process:  normal  Thought content:    WNL  Sensory/Perceptual disturbances:    WNL  Orientation:  oriented to person, place, time/date and situation  Attention:  Good  Concentration:  Good  Memory:  WNL  Fund of knowledge:   Good  Insight:    Good  Judgment:   Good  Impulse Control:  Good   Risk Assessment: Danger to Self:  No Self-injurious Behavior: No Danger to Others: No Duty to Warn:no Physical Aggression / Violence:No  Access to Firearms a concern: No  Gang Involvement:No   Subjective: Telehealth visit I connected with patient by a video enabled telemedicine/telehealth application or telephone, with his informed consent, and verified patient privacy and that I am speaking with the correct person using two identifiers.  I was located at my office and patient at his home.  We discussed the limitations, risks, and security and privacy concerns associated with telehealth services and the availability of in-person appointments, including awareness that he [name] be responsible for charges related to the service, and he expressed understanding and agreed to proceed.  I discussed treatment planning with him, with opportunity to ask and answer all questions. Agreed with the plan, demonstrated an understanding of the instructions, and made him aware to call our office if symptoms worsen or he feels he is in a crisis state and needs immediate contact. The client did talk with his wife about using their exercise bike.  He felt like it went very well and was well received.  He had written  out what he wanted to say and then practice it.  "I want to make sure she is taking care of."  This past weekend was Mother's Day which the client said his wife really enjoyed. The client continues to work from home and has had difficulty because, "we cannot do are normal day to day."  He has noticed an increase in his stress since being at home so much.  He states he does have some paid time off coming up for a wedding that he and his wife were going to attend in [place].  The wedding has been canceled but he is keeping the time off.  He is hopeful he and his wife can do something together. The client was sad today because his grandfather's farm in [place] is being sold as of July 1.  This is a homestead that he grew up visiting a lot as a child.  He has many fond memories and is sad to see it ago.  He discussed at length how important family is to him and his desire to return to the [name]. The client is doing well in his new job with [employer].  He sees his 3 strengths as building relationships, problem solving and teaching or mentoring people.  We discussed what it would take for him to move into a leadership position and what things he needs to do to improve his opportunities.  The client sees 2 things that are problems.  One is self-confidence and the other is his  ability to trust his judgment.  I asked the client to take the [name] on 16 personalities[employer].  At our next session he will share those results with me.  We will use that as an understanding of what his personality style is so that he can gain greater acceptance and hopefully increase his confidence.  Client agrees.  Interventions: Assertiveness/Communication, Solution-Oriented/Positive Psychology and Insight-Oriented  Diagnosis:   ICD-10-CM   1. Adjustment disorder with depressed mood F43.21     Plan: MBTI testing, self care, exercise, assertiveness, boundaries.  This record has been  created using [employer] software.  Chart creation errors have been sought, but Mailyn Steichen not always have been located and corrected. Such creation errors do not reflect on the standard of medical care.  Gelene MinkFrederick Dacari Beckstrand, [facility]

## 2019-01-16 ENCOUNTER — Telehealth: Payer: Self-pay | Admitting: Physician Assistant

## 2019-01-16 ENCOUNTER — Other Ambulatory Visit: Payer: Self-pay

## 2019-01-16 MED ORDER — LISDEXAMFETAMINE DIMESYLATE 70 MG PO CAPS
70.0000 mg | ORAL_CAPSULE | Freq: Every day | ORAL | 0 refills | Status: DC
Start: 2019-01-16 — End: 2019-02-06

## 2019-01-16 NOTE — Telephone Encounter (Signed)
Pt request refill on V[facility]vanse @ [facility] [address]

## 2019-01-16 NOTE — Telephone Encounter (Signed)
Pended for approval, f/up scheduled

## 2019-01-20 NOTE — Telephone Encounter (Signed)
Has appt 6/12 w/ T. [employer]

## 2019-01-30 ENCOUNTER — Ambulatory Visit: Payer: BLUE CROSS/BLUE SHIELD | Admitting: Psychiatry

## 2019-02-05 ENCOUNTER — Ambulatory Visit (INDEPENDENT_AMBULATORY_CARE_PROVIDER_SITE_OTHER): Payer: BC Managed Care – PPO | Admitting: Psychiatry

## 2019-02-05 ENCOUNTER — Other Ambulatory Visit: Payer: Self-pay

## 2019-02-05 ENCOUNTER — Encounter: Payer: Self-pay | Admitting: Psychiatry

## 2019-02-05 DIAGNOSIS — F4321 Adjustment disorder with depressed mood: Secondary | ICD-10-CM
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
## 2019-02-06 ENCOUNTER — Ambulatory Visit (INDEPENDENT_AMBULATORY_CARE_PROVIDER_SITE_OTHER): Payer: BC Managed Care – PPO | Admitting: Physician Assistant

## 2019-02-06 ENCOUNTER — Other Ambulatory Visit: Payer: Self-pay

## 2019-02-06 ENCOUNTER — Encounter: Payer: Self-pay | Admitting: Physician Assistant

## 2019-02-06 DIAGNOSIS — F902 Attention-deficit hyperactivity disorder, combined type: Secondary | ICD-10-CM | POA: Diagnosis not present

## 2019-02-06 DIAGNOSIS — F4310 Post-traumatic stress disorder, unspecified: Secondary | ICD-10-CM | POA: Diagnosis not present

## 2019-02-06 MED ORDER — LISDEXAMFETAMINE DIMESYLATE 70 MG PO CAPS: 70.0000 mg | ORAL_CAPSULE | Freq: Every day | ORAL | 0 refills | Status: DC
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
## 2019-02-25 DIAGNOSIS — S51811A Laceration without foreign body of right forearm, initial encounter: Secondary | ICD-10-CM | POA: Diagnosis not present

## 2019-04-13 DIAGNOSIS — F39 Unspecified mood [affective] disorder: Secondary | ICD-10-CM | POA: Diagnosis not present

## 2019-04-20 ENCOUNTER — Telehealth: Payer: Self-pay | Admitting: Physician Assistant

## 2019-04-20 NOTE — Telephone Encounter (Signed)
Patient already has Vyvanse Rx at pharmacy

## 2019-04-20 NOTE — Telephone Encounter (Signed)
Patient need refill on Vyvanse 70 mg., [facility]

## 2019-05-20 ENCOUNTER — Encounter: Payer: Self-pay | Admitting: Psychiatry

## 2019-05-20 ENCOUNTER — Ambulatory Visit (INDEPENDENT_AMBULATORY_CARE_PROVIDER_SITE_OTHER): Payer: BC Managed Care – PPO | Admitting: Psychiatry

## 2019-05-20 ENCOUNTER — Other Ambulatory Visit: Payer: Self-pay

## 2019-05-20 DIAGNOSIS — F4321 Adjustment disorder with depressed mood: Secondary | ICD-10-CM
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
## 2019-06-05 ENCOUNTER — Ambulatory Visit: Payer: BC Managed Care – PPO | Admitting: Psychiatry

## 2019-06-19 ENCOUNTER — Ambulatory Visit: Payer: BC Managed Care – PPO | Admitting: Psychiatry

## 2019-07-03 ENCOUNTER — Ambulatory Visit: Payer: BC Managed Care – PPO | Admitting: Psychiatry

## 2019-07-17 ENCOUNTER — Ambulatory Visit: Payer: BC Managed Care – PPO | Admitting: Psychiatry

## 2019-07-20 ENCOUNTER — Telehealth: Payer: Self-pay | Admitting: Physician Assistant

## 2019-07-20 NOTE — Telephone Encounter (Signed)
Submitted

## 2019-07-20 NOTE — Telephone Encounter (Signed)
Patient called and said that he needs a refill on his vyvanse 90 day supply. He has an appointment 12/29. [facility] is [facility] [address] [facility]

## 2019-08-06 ENCOUNTER — Other Ambulatory Visit: Payer: Self-pay

## 2019-08-06 ENCOUNTER — Ambulatory Visit (INDEPENDENT_AMBULATORY_CARE_PROVIDER_SITE_OTHER): Payer: BC Managed Care – PPO | Admitting: Psychiatry

## 2019-08-06 ENCOUNTER — Encounter: Payer: Self-pay | Admitting: Psychiatry

## 2019-08-06 DIAGNOSIS — F4321 Adjustment disorder with depressed mood: Secondary | ICD-10-CM

## 2019-08-06 NOTE — Progress Notes (Signed)
      [facility] Counselor/Therapist Progress Note  Patient ID: Jason Pittman, MRN: [id number],    Date: 08/06/2019  Time Spent: 70 minutes   Treatment Type: Individual Therapy  Reported Symptoms: irritability, frustration  Mental Status Exam:  Appearance:   Casual     Behavior:  Appropriate  Motor:  Normal  Speech/Language:   Clear and Coherent  Affect:  Appropriate  Mood:  irritable  Thought process:  normal  Thought content:    WNL  Sensory/Perceptual disturbances:    WNL  Orientation:  oriented to person, place, time/date and situation  Attention:  Good  Concentration:  Good  Memory:  WNL  Fund of knowledge:   Good  Insight:    Good  Judgment:   Good  Impulse Control:  Good   Risk Assessment: Danger to Self:  No Self-injurious Behavior: No Danger to Others: No Duty to Warn:no Physical Aggression / Violence:No  Access to Firearms a concern: No  Gang Involvement:No   Subjective: The client states that he has followed up with check ins with his mom who lives in [place].  He is a little put out because she has been less than communicative when he calls.  His brother who lives near his mom seems to have a closer relationship with her.  The client just found out at his brother and sister-in-law are adopting a baby.  He saw their Facebook page and go fund me page.  He was upset that he was out of the loop.  He had seen both his mother and his brother recently and they had not told him. I used eye-movement with the client around his relationship with his mom.  His negative cognition is, "I do not understand."  He feels agitation in his chest.  His subjective units of distress is an 8.  As the client processed he stated, "I have questions that will never be answered."  He knows that his mother has narcissistic behavior and tends to make everything about her.  He has been trying to connect with her but she has been flat in her responses.  We discussed how the client will  respond when the circumstances with his mom implodes.  He agrees that they will "and it Jason Pittman be a relief."  The client's goal is to try to love and honor his mother while at the same time setting appropriate boundaries for her.  He became very sad as he thought about the fracture in their relationship.  I explained to the client that his mother has a huge wound that she defends with her narcissism.  The only thing he can control is himself to which the client agrees.  He will focus on doing the best that he can.  His subjective units of distress at the end of the session was less than 3.  Interventions: Assertiveness/Communication, Motivational Interviewing, Solution-Oriented/Positive Psychology, [employer] Desensitization and Reprocessing (EMDR) and Insight-Oriented  Diagnosis:   ICD-10-CM   1. Adjustment disorder with depressed mood  F43.21     Plan: Positive self talk, mood independent behavior, assertiveness, boundaries, self-care, exercise.  Wenda Vanschaick, [facility]

## 2019-08-25 ENCOUNTER — Ambulatory Visit: Payer: BC Managed Care – PPO | Admitting: Physician Assistant

## 2019-09-11 ENCOUNTER — Encounter: Payer: Self-pay | Admitting: Psychiatry

## 2019-09-11 ENCOUNTER — Other Ambulatory Visit: Payer: Self-pay

## 2019-09-11 ENCOUNTER — Ambulatory Visit (INDEPENDENT_AMBULATORY_CARE_PROVIDER_SITE_OTHER): Payer: 59 | Admitting: Psychiatry

## 2019-09-11 DIAGNOSIS — F4321 Adjustment disorder with depressed mood: Secondary | ICD-10-CM | POA: Diagnosis not present

## 2019-09-11 NOTE — Progress Notes (Signed)
      [facility] Counselor/Therapist Progress Note  Patient ID: Jason Pittman, MRN: [id number],    Date: 09/11/2019  Time Spent: 45 minutes   Treatment Type: Individual Therapy  Reported Symptoms: frustration  Mental Status Exam:  Appearance:   Well Groomed     Behavior:  Appropriate  Motor:  Normal  Speech/Language:   Clear and Coherent  Affect:  Appropriate  Mood:  irritable  Thought process:  normal  Thought content:    WNL  Sensory/Perceptual disturbances:    WNL  Orientation:  oriented to person, place, time/date and situation  Attention:  Good  Concentration:  Good  Memory:  WNL  Fund of knowledge:   Good  Insight:    Good  Judgment:   Good  Impulse Control:  Good   Risk Assessment: Danger to Self:  No Self-injurious Behavior: No Danger to Others: No Duty to Warn:no Physical Aggression / Violence:No  Access to Firearms a concern: No  Gang Involvement:No   Subjective: The client states they had a low-key Christmas.  They did not travel to the [place] as they usually do.  "We stayed here with our kids."  Today we focused on the client's personal work issues.  He recently had a year and evaluation which went well.  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  We discussed the issues around racism which seemed to regularly come up.  The client is not well-informed about history of racism and how it is been impacting the corporate world.  I told him I would supply him with a PDF called "undoing racism" which is a workshop offered in [place] to understand the impact of racism over the centuries.  I explained it is well done and is not supporting any political agenda.  The client states he would be very interested in this. Going forward in the new year the client is going to focus on improving his visibility to the decision-makers above him.  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  He wants to  do some outside projects that we will get him some of that visibility.  I asked the client what are any emotional or personality characteristics that Jason Pittman get in the way.  The client states it might be his lack of understanding about certain subjects.  He will think more on this and we will discuss that at next session.  Interventions: Assertiveness/Communication, Motivational Interviewing, Solution-Oriented/Positive Psychology and Insight-Oriented  Diagnosis:   ICD-10-CM   1. Adjustment disorder with depressed mood  F43.21     Plan: Increasing self awareness, positive self talk, assertiveness, boundaries, exercise, self-care.  Jason Pittman, [facility]

## 2019-09-17 ENCOUNTER — Ambulatory Visit: Payer: BC Managed Care – PPO | Admitting: Psychiatry

## 2019-09-28 ENCOUNTER — Encounter: Payer: Self-pay | Admitting: Physician Assistant

## 2019-09-28 ENCOUNTER — Other Ambulatory Visit: Payer: Self-pay

## 2019-09-28 ENCOUNTER — Ambulatory Visit (INDEPENDENT_AMBULATORY_CARE_PROVIDER_SITE_OTHER): Payer: 59 | Admitting: Physician Assistant

## 2019-09-28 DIAGNOSIS — F4310 Post-traumatic stress disorder, unspecified: Secondary | ICD-10-CM

## 2019-09-28 DIAGNOSIS — F902 Attention-deficit hyperactivity disorder, combined type: Secondary | ICD-10-CM

## 2019-09-28 MED ORDER — LISDEXAMFETAMINE DIMESYLATE 70 MG PO CAPS: 70.0000 mg | ORAL_CAPSULE | Freq: Every day | ORAL | 0 refills | Status: DC

## 2019-09-28 NOTE — Progress Notes (Signed)
[facility] Med Check  Patient ID: Jason Pittman,  MRN: [id number]  PCP: System, Provider Not In  Date of Evaluation: 09/28/2019 Time spent:20 minutes  Chief Complaint:  Chief Complaint    ADHD; Follow-up      HISTORY/CURRENT STATUS: HPI for 61-month med check.  [name] states he is doing very well.  The Vyvanse is working great.  He is able to finish things and not get distracted so easily.  He is able to concentrate well.  Having no side effects from it.  No palpitations or sleep difficulties.  Patient denies loss of interest in usual activities and is able to enjoy things.  Denies decreased energy or motivation.  Appetite has not changed.  No extreme sadness, tearfulness, or feelings of hopelessness.  Denies any changes in concentration, making decisions or remembering things.  Denies suicidal or homicidal thoughts.    He still seeing Fred May, [facility] for counseling, which has been very good for him.  Denies dizziness, syncope, seizures, numbness, tingling, tremor, tics, unsteady gait, slurred speech, confusion. Denies muscle or joint pain, stiffness, or dystonia.  Individual Medical History/ Review of Systems: Changes? :No    Past medications for mental health diagnoses include: Seroquel, Klonopin, Zoloft, Vyvanse  Allergies: Patient has no known allergies.  Current Medications:  Current Outpatient Medications:  .  finasteride (PROSCAR) 5 MG tablet, Take 5 mg by mouth daily., Disp: , Rfl:  .  ibuprofen (ADVIL,MOTRIN) 200 MG tablet, Take 200 mg by mouth every 6 (six) hours as needed., Disp: , Rfl:  .  [START ON 01/14/2020] lisdexamfetamine (VYVANSE) 70 MG capsule, Take 1 capsule (70 mg total) by mouth daily., Disp: 90 capsule, Rfl: 0 .  [START ON 10/19/2019] lisdexamfetamine (VYVANSE) 70 MG capsule, Take 1 capsule (70 mg total) by mouth daily., Disp: 90 capsule, Rfl: 0 .  Multiple Vitamin (MULTIVITAMIN) tablet, Take 1 tablet by mouth daily., Disp: , Rfl:  Medication Side  Effects: none  Family Medical/ Social History: Changes?  He has been working from home since March due to coronavirus pandemic.    MENTAL HEALTH EXAM:  There were no vitals taken for this visit.There is no height or weight on file to calculate BMI.  General Appearance: Casual, Neat and Well Groomed  Eye Contact:  Good  Speech:  Clear and Coherent  Volume:  Normal  Mood:  Euthymic  Affect:  unable to assess  Thought Process:  Goal Directed and Descriptions of Associations: Intact  Orientation:  Full (Time, Place, and Person)  Thought Content: Logical   Suicidal Thoughts:  No  Homicidal Thoughts:  No  Memory:  WNL  Judgement:  Good  Insight:  Good  Psychomotor Activity:  Normal  Concentration:  Concentration: Good and Attention Span: Good  Recall:  Good  Fund of Knowledge: Good  Language: Good  Assets:  Desire for Improvement  ADL's:  Intact  Cognition: WNL  Prognosis:  Good    DIAGNOSES:    ICD-10-CM   1. Attention deficit hyperactivity disorder (ADHD), combined type  F90.2   2. PTSD (post-traumatic stress disorder)  F43.10     Receiving Psychotherapy: Yes  with [name], LPC   RECOMMENDATIONS:  Continue Vyvanse 70 mg p.o. every morning.  PDMP was reviewed. Continue psychotherapy with [name], LPC. Return in 6 months.   Melony Overly, PA-C

## 2019-10-15 ENCOUNTER — Other Ambulatory Visit: Payer: Self-pay

## 2019-10-15 ENCOUNTER — Encounter: Payer: Self-pay | Admitting: Psychiatry

## 2019-10-15 ENCOUNTER — Ambulatory Visit (INDEPENDENT_AMBULATORY_CARE_PROVIDER_SITE_OTHER): Payer: 59 | Admitting: Psychiatry

## 2019-10-15 DIAGNOSIS — F4321 Adjustment disorder with depressed mood: Secondary | ICD-10-CM | POA: Diagnosis not present
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
## 2019-11-06 ENCOUNTER — Other Ambulatory Visit: Payer: Self-pay

## 2019-11-06 ENCOUNTER — Ambulatory Visit (INDEPENDENT_AMBULATORY_CARE_PROVIDER_SITE_OTHER): Payer: 59 | Admitting: Psychiatry

## 2019-11-06 ENCOUNTER — Encounter: Payer: Self-pay | Admitting: Psychiatry

## 2019-11-06 DIAGNOSIS — F4321 Adjustment disorder with depressed mood: Secondary | ICD-10-CM
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
## 2019-11-08 ENCOUNTER — Encounter (HOSPITAL_COMMUNITY): Payer: Self-pay | Admitting: Emergency Medicine

## 2019-11-08 ENCOUNTER — Other Ambulatory Visit: Payer: Self-pay

## 2019-11-08 ENCOUNTER — Ambulatory Visit (HOSPITAL_COMMUNITY)
Admission: EM | Admit: 2019-11-08 | Discharge: 2019-11-08 | Disposition: A | Discharge status: Home / Self Care | Payer: Non-veteran care | Attending: Family Medicine | Admitting: Family Medicine

## 2019-11-08 DIAGNOSIS — M5116 Intervertebral disc disorders with radiculopathy, lumbar region: Secondary | ICD-10-CM | POA: Diagnosis not present

## 2019-11-08 MED ORDER — METHYLPREDNISOLONE 4 MG PO TBPK: ORAL_TABLET | ORAL | 0 refills | Status: DC

## 2019-11-08 MED ORDER — TIZANIDINE HCL 4 MG PO TABS: 4.0000 mg | ORAL_TABLET | Freq: Four times a day (QID) | ORAL | 0 refills | Status: DC | PRN

## 2019-11-08 MED ORDER — OXYCODONE-ACETAMINOPHEN 5-325 MG PO TABS: 1.0000 | ORAL_TABLET | Freq: Four times a day (QID) | ORAL | 0 refills | Status: DC | PRN

## 2019-11-08 NOTE — Discharge Instructions (Signed)
Activity as tolerated Take the Medrol Dosepak as scheduled.  Take all of day 1 today. Take tizanidine as needed muscle relaxer.  This is useful at bedtime. Take oxycodone if needed for severe pain.  Do not drive on oxycodone. Use ice or heat to painful back area Return if worse at any time instead of better When you finish the Medrol, you may switch to ibuprofen 600 mg for pain

## 2019-11-08 NOTE — ED Triage Notes (Signed)
Pain in lower back started this morning around 9am, no known injury.  Patient has a history of back issues.  Patient has shooting pain in right leg.

## 2019-11-08 NOTE — ED Provider Notes (Signed)
[facility]    CSN: [id number] Arrival date & time: 11/08/19  1614      History   Chief Complaint Chief Complaint  Patient presents with  . Back Pain    HPI Jason Pittman is a 34 y.o. male.   HPI  Patient has a history of a service related back injury.  He has a disc problem at L5 with pressure on the right S1 root.  He states that he did not have any unusual activity yesterday, no bending or lifting, he did routine yard and housework.  He did not have any fall or injury.  He states that he woke up this morning with some pain in his right low back.  Took ibuprofen.  Try to do some stretches.  Is gotten worse throughout the day.  Now he has significant low back pain right greater than left.  He has pain that goes down the back of the right leg to the knee.  No numbness or weakness.  No bowel or bladder complaint.  He will follow up with the [facility].  Past Medical History:  Diagnosis Date  . Scaphoid fracture of wrist 07/2013   right    Patient Active Problem List   Diagnosis Date Noted  . 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 DECOMPRESSION FASCIOTOMY LEG Bilateral    as a teenager  . MEDIAN NERVE REPAIR Left 05/19/2009  . OPEN REDUCTION INTERNAL FIXATION (ORIF) SCAPHOID WITH DISTAL RADIUS GRAFT Right 09/03/2013   Procedure: Autologenous Distal Radius bone graft to scaphoid, ;  Surgeon: Wyn Forster., MD;  Location: [facility];  Service: Orthopedics;  Laterality: Right;  . WISDOM TOOTH EXTRACTION    . WOUND DEBRIDEMENT Left 05/19/2009   left brachial       Home Medications    Prior to Admission medications   Medication Sig Start Date End Date Taking? Authorizing Provider  finasteride (PROSCAR) 5 MG tablet Take 5 mg by mouth daily. 01/17/19  Yes [provider]  ibuprofen (ADVIL,MOTRIN) 200 MG tablet Take 200 mg by mouth  every 6 (six) hours as needed.   Yes [provider]  lisdexamfetamine (VYVANSE) 70 MG capsule Take 1 capsule (70 mg total) by mouth daily. 01/14/20  Yes Melony Overly T, PA-C  Multiple Vitamin (MULTIVITAMIN) tablet Take 1 tablet by mouth daily.   Yes [provider]  lisdexamfetamine (VYVANSE) 70 MG capsule Take 1 capsule (70 mg total) by mouth daily. 10/19/19   Melony Overly T, PA-C  methylPREDNISolone (MEDROL DOSEPAK) 4 MG TBPK tablet tad 11/08/19   Eustace Moore, MD  oxyCODONE-acetaminophen (PERCOCET) 5-325 MG tablet Take 1-2 tablets by mouth every 6 (six) hours as needed for severe pain. 11/08/19   Eustace Moore, MD  tiZANidine (ZANAFLEX) 4 MG tablet Take 1-2 tablets (4-8 mg total) by mouth every 6 (six) hours as needed for muscle spasms. 11/08/19   Eustace Moore, MD    Family History Family History  Problem Relation Age of Onset  . Healthy Mother   . Healthy Father     Social History Social History   Tobacco Use  . Smoking status: Former Smoker    Quit date: 08/08/2012    Years since quitting: 7.2  . Smokeless tobacco: Never Used  Substance Use Topics  . Alcohol use: Yes    Alcohol/week: 2.0 standard drinks    Types: 2 Cans of  beer per week  . Drug use: No     Allergies   Patient has no known allergies.   Review of Systems Review of Systems  Musculoskeletal: Positive for back pain.     Physical Exam Triage Vital Signs ED Triage Vitals  Enc Vitals Group     BP 11/08/19 1723 115/68     Pulse Rate 11/08/19 1723 85     Resp 11/08/19 1723 20     Temp 11/08/19 1723 98.1 F (36.7 C)     Temp Source 11/08/19 1723 Oral     SpO2 11/08/19 1723 100 %     Weight --      Height --      Head Circumference --      Peak Flow --      Pain Score 11/08/19 1720 9     Pain Loc --      Pain Edu? --      Excl. in GC? --    No data found.  Updated Vital Signs BP 115/68 (BP Location: Right Arm)   Pulse 85   Temp 98.1 F (36.7 C) (Oral)    Resp 20   SpO2 100%   Visual Acuity Right Eye Distance:   Left Eye Distance:   Bilateral Distance:    Right Eye Near:   Left Eye Near:    Bilateral Near:     Physical Exam Constitutional:      General: He is not in acute distress.    Appearance: He is well-developed and normal weight.     Comments: Lean.  Appears uncomfortable.  Guarded movements  HENT:     Head: Normocephalic and atraumatic.     Mouth/Throat:     Comments: Mask in place Eyes:     Conjunctiva/sclera: Conjunctivae normal.     Pupils: Pupils are equal, round, and reactive to light.  Cardiovascular:     Rate and Rhythm: Normal rate.  Pulmonary:     Effort: Pulmonary effort is normal. No respiratory distress.  Musculoskeletal:        General: Normal range of motion.     Cervical back: Normal range of motion.     Comments: Tenderness to deep palpation of the right SI region.  No palpable muscle spasm.  Range of motion is limited secondary to pain.  Straight leg raise on the right causes increased sciatica pain.  No noted muscular weakness.  Skin:    General: Skin is warm and dry.  Neurological:     General: No focal deficit present.     Mental Status: He is alert.     Gait: Gait abnormal.  Psychiatric:        Mood and Affect: Mood normal.        Behavior: Behavior normal.      UC Treatments / Results  Labs (all labs ordered are listed, but only abnormal results are displayed) Labs Reviewed - No data to display  EKG   Radiology No results found.  Procedures Procedures (including critical care time)  Medications Ordered in UC Medications - No data to display  Initial Impression / Assessment and Plan / UC Course  I have reviewed the triage vital signs and the nursing notes.  Pertinent labs & imaging results that were available during my care of the patient were reviewed by me and considered in my medical decision making (see chart for details).     We will treat with steroids, and  follow-up with NSAID Tizanidine as needed  for muscle relaxer Percocet as needed for [facility] Final Clinical Impressions(s) / UC Diagnoses   Final diagnoses:  Lumbar disc disease with radiculopathy     Discharge Instructions     Activity as tolerated Take the Medrol Dosepak as scheduled.  Take all of day 1 today. Take tizanidine as needed muscle relaxer.  This is useful at bedtime. Take oxycodone if needed for severe pain.  Do not drive on oxycodone. Use ice or heat to painful back area Return if worse at any time instead of better When you finish the Medrol, you may switch to ibuprofen 600 mg for pain   ED Prescriptions    Medication Sig Dispense Auth. Provider   methylPREDNISolone (MEDROL DOSEPAK) 4 MG TBPK tablet tad 21 tablet Raylene Everts, MD   tiZANidine (ZANAFLEX) 4 MG tablet Take 1-2 tablets (4-8 mg total) by mouth every 6 (six) hours as needed for muscle spasms. 21 tablet Raylene Everts, MD   oxyCODONE-acetaminophen (PERCOCET) 5-325 MG tablet Take 1-2 tablets by mouth every 6 (six) hours as needed for severe pain. 15 tablet Raylene Everts, MD     I have reviewed the PDMP during this encounter.   Raylene Everts, MD 11/08/19 [phone]

## 2019-12-09 ENCOUNTER — Ambulatory Visit: Payer: 59 | Admitting: Psychiatry

## 2019-12-14 ENCOUNTER — Ambulatory Visit (INDEPENDENT_AMBULATORY_CARE_PROVIDER_SITE_OTHER): Payer: 59 | Admitting: Psychiatry

## 2019-12-14 ENCOUNTER — Encounter: Payer: Self-pay | Admitting: Psychiatry

## 2019-12-14 ENCOUNTER — Other Ambulatory Visit: Payer: Self-pay

## 2019-12-14 DIAGNOSIS — F4321 Adjustment disorder with depressed mood: Secondary | ICD-10-CM

## 2019-12-14 NOTE — Progress Notes (Signed)
      [facility] Counselor/Therapist Progress Note  Patient ID: Jason Pittman, MRN: [id number],    Date: 12/14/2019  Time Spent: 50 minutes   Treatment Type: Individual Therapy  Reported Symptoms: sad  Mental Status Exam:  Appearance:   Casual     Behavior:  Appropriate  Motor:  Normal  Speech/Language:   Clear and Coherent  Affect:  Appropriate  Mood:  sad  Thought process:  normal  Thought content:    WNL  Sensory/Perceptual disturbances:    WNL  Orientation:  oriented to person, place, time/date and situation  Attention:  Good  Concentration:  Good  Memory:  WNL  Fund of knowledge:   Good  Insight:    Good  Judgment:   Good  Impulse Control:  Good   Risk Assessment: Danger to Self:  No Self-injurious Behavior: No Danger to Others: No Duty to Warn:no Physical Aggression / Violence:No  Access to Firearms a concern: No  Gang Involvement:No   Subjective: Client states that his mother and stepdad are visiting.  So far he feels the visit is gone well.  The client has struggled with his mom's narcissism in the past but feels he has gotten to a place of radical acceptance with her.  He knows that she is fragile because of her own past.  He has tried to make the visit as pleasant as possible. Today I use the eye-movement focusing on the clients war experience.  He notes that one day he wants to be able to tell his children about the war but seen in a positive light.  His negative cognition is, "it was difficult."  His subjective units of distress is a 6.  He feels sadness in his chest.  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  He received a lot of training on persistence.  He learned about the chain of command.  He also found that he tends to not see race.  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  He noted that what he was most aware of was their rank.  He became a little tearful as he talked about this.  I encouraged the client to feel his emotion.  There clearly episodes he will not talk to his children about but displayed the emotions communicates the gravity of what went on.  The client agrees.  As he continued to process his subjective units of distress was less than 2.  He feels like she will be able to have a better conversation with his children when it is appropriate.  Interventions: Dialectical Behavioral Therapy, Motivational Interviewing, Solution-Oriented/Positive Psychology, Eye Movement Desensitization and Reprocessing (EMDR) and Insight-Oriented  Diagnosis:   ICD-10-CM   1. Adjustment disorder with depressed mood  F43.21     Plan: Radical acceptance, mood independent behavior, self-care, exercise, assertiveness, positive self talk, boundaries.  Gelene Mink Tydus Sanmiguel, [facility]

## 2020-01-18 ENCOUNTER — Telehealth: Payer: Self-pay | Admitting: Physician Assistant

## 2020-01-18 ENCOUNTER — Other Ambulatory Visit: Payer: Self-pay

## 2020-01-18 MED ORDER — LISDEXAMFETAMINE DIMESYLATE 70 MG PO CAPS: 70.0000 mg | ORAL_CAPSULE | Freq: Every day | ORAL | 0 refills | Status: DC

## 2020-01-18 NOTE — Telephone Encounter (Signed)
The pt's [facility] [address] but they are out of the med and cannot get it. Can his RX for Vyvanse 70mg  be changed to [facility] [address]. Thay have it and can fill it today. Thanks.

## 2020-01-18 NOTE — Telephone Encounter (Signed)
OK 

## 2020-01-19 ENCOUNTER — Encounter: Payer: Self-pay | Admitting: Psychiatry

## 2020-01-19 ENCOUNTER — Ambulatory Visit (INDEPENDENT_AMBULATORY_CARE_PROVIDER_SITE_OTHER): Payer: 59 | Admitting: Psychiatry

## 2020-01-19 ENCOUNTER — Other Ambulatory Visit: Payer: Self-pay

## 2020-01-19 DIAGNOSIS — F4321 Adjustment disorder with depressed mood: Secondary | ICD-10-CM | POA: Diagnosis not present

## 2020-01-19 NOTE — Progress Notes (Signed)
      [facility] Counselor/Therapist Progress Note  Patient ID: Jason Pittman, MRN: [id number],    Date: 01/19/2020  Time Spent: 50 minutes   Treatment Type: Individual Therapy  Reported Symptoms: sad  Mental Status Exam:  Appearance:   Casual     Behavior:  Appropriate  Motor:  Normal  Speech/Language:   Clear and Coherent  Affect:  Appropriate  Mood:  sad  Thought process:  normal  Thought content:    WNL  Sensory/Perceptual disturbances:    WNL  Orientation:  oriented to person, place, time/date and situation  Attention:  Good  Concentration:  Good  Memory:  WNL  Fund of knowledge:   Good  Insight:    Good  Judgment:   Good  Impulse Control:  Good   Risk Assessment: Danger to Self:  No Self-injurious Behavior: No Danger to Others: No Duty to Warn:no Physical Aggression / Violence:No  Access to Firearms a concern: No  Gang Involvement:No   Subjective: The client is very sad today.  His wife recently had a miscarriage.  She was [redacted] weeks along.  The client states that is been a very sad and stressful time for both he and his wife.  He states that his wife had had a procedure in September to strip the lining of her uterus.  The expectation was that she would not be able to get pregnant.  She did and apparently the pregnancy would be very dangerous for both the baby and the client's wife.  She ended up miscarrying.  The client is clearly very sad about this.  I used eye-movement with the client focusing on this.  His negative cognition was, "my wife was in danger."  He feels sadness in his chest.  His subjective units of distress is a 6.  As the client processed he stated they have had to have a lot of radical acceptance about the situation.  They have decided that he will go ahead and have a vasectomy.  The client states he is good with this.  As the client discussed what happened it turns out that it was an ectopic pregnancy.  "There was no good in any of this."  He feels  sad for the loss of their baby.  We discussed maybe doing some small things to memorialize this loss child.  He agreed this might be good and would talk with his wife about it.  At the end of the session his subjective units of distress was less than 1.  His positive cognition was, "I have peace."  Interventions: Motivational Interviewing, Solution-Oriented/Positive Psychology, [employer] Desensitization and Reprocessing (EMDR) and Insight-Oriented  Diagnosis:   ICD-10-CM   1. Adjustment disorder with depressed mood  F43.21     Plan: Radical acceptance, positive self talk, self-care, exercise, grief process, memorialize the lost baby.  Jason Pittman, [facility]

## 2020-03-10 ENCOUNTER — Encounter: Payer: Self-pay | Admitting: Psychiatry

## 2020-03-10 ENCOUNTER — Other Ambulatory Visit: Payer: Self-pay

## 2020-03-10 ENCOUNTER — Ambulatory Visit (INDEPENDENT_AMBULATORY_CARE_PROVIDER_SITE_OTHER): Payer: No Typology Code available for payment source | Admitting: Psychiatry

## 2020-03-10 DIAGNOSIS — F4323 Adjustment disorder with mixed anxiety and depressed mood: Secondary | ICD-10-CM

## 2020-03-10 NOTE — Progress Notes (Signed)
[facility] Counselor/Therapist Progress Note  Patient ID: Jason Pittman, MRN: [id number],    Date: 03/10/2020  Time Spent: 50 Minutes   Treatment Type: Individual Therapy  Reported Symptoms: irritable, anxious, sad  Mental Status Exam:  Appearance:   Casual     Behavior:  Appropriate  Motor:  Normal  Speech/Language:   Clear and Coherent  Affect:  Appropriate  Mood:  anxious, irritable and sad  Thought process:  normal  Thought content:    WNL  Sensory/Perceptual disturbances:    WNL  Orientation:  oriented to person, place, time/date and situation  Attention:  Good  Concentration:  Good  Memory:  WNL  Fund of knowledge:   Good  Insight:    Good  Judgment:   Good  Impulse Control:  Good   Risk Assessment: Danger to Self:  No Self-injurious Behavior: No Danger to Others: No Duty to Warn:no Physical Aggression / Violence:No  Access to Firearms a concern: No  Gang Involvement:No   Subjective: "My anxiety and irritability is high.  I just got back from [place].  My wife's family is very [place].  I am not sure why I am agitated."  The client thought maybe he needed his own space more and maybe he is upset that his wife's family is so much better than his own family of origin.  Today I used eye-movement with the client focusing on his anxiety and irritability that he feels in his hand in his chest.  His negative cognition is, "I lack control."  His subjective units of distress is a 6.  As the client processed, he stated he and his family had gone up to [place] for vacation.  While they were gone they were getting their floors done from some water damage that had occurred.  The client noticed that his WiFi was down because he could not control his air conditioning system or other smart appliances.  He had his neighbor go over and get his WiFi reconnected.  His neighbor called him to let him know he had another water leak from his washing machine.  The client  was upset that there was now more water damage on the new floors that had been replaced.  He also noted that he has felt more disconnected from his wife.  Due to the miscarriage and other male problems she experienced they have not had sex in a while.  As the client discussed all this I pointed out how much was out of control for him.  I asked him if he was taking very good care of himself?  As an introvert he gets overstimulated with a lot of people and needs time by himself to recharge.  The client admitted that he had not taken his good care of himself as he should have.  He was not getting any recharge time.  I pointed out that this could contribute to his level of irritability.  I suggested that he try the supplement L-theanine 600 mg which can help reduce his irritability overall.  The client will consider that.  As he continued to process his subjective units of distress began to drop.  He notices sometimes that if he gets upset he will "sulk in it."  He is trying to move out of that.  As he continued to process, he noted how engaged his wife was with him.  This was comforting for him.  He also discussed that the current withdrawal from [place] and the way it  is being done distresses him.  We then discussed the concept of radical acceptance and the need to accept things that he has no control over.  The client was able to grasp that.  I explained that radical acceptance coupled with good self-care will go a long ways towards reducing his anxiety and his irritability.  The client agreed.  His positive cognition was, "I can take care of myself."  His subjective units of distress was less than 1.  Interventions: Assertiveness/Communication, Motivational Interviewing, Solution-Oriented/Positive Psychology, [employer] Desensitization and Reprocessing (EMDR) and Insight-Oriented  Diagnosis:   ICD-10-CM   1. Adjustment disorder with mixed anxiety and depressed mood  F43.23     Plan: Self-care,  exercise, positive self talk, l-theanine, assertiveness, boundaries, radical acceptance.  Gelene Mink Rohen Kimes, [facility]

## 2020-04-01 ENCOUNTER — Encounter: Payer: Self-pay | Admitting: Physician Assistant

## 2020-04-01 ENCOUNTER — Other Ambulatory Visit: Payer: Self-pay

## 2020-04-01 ENCOUNTER — Ambulatory Visit (INDEPENDENT_AMBULATORY_CARE_PROVIDER_SITE_OTHER): Payer: No Typology Code available for payment source | Admitting: Physician Assistant

## 2020-04-01 VITALS — BP 142/98 | HR 102

## 2020-04-01 DIAGNOSIS — F4310 Post-traumatic stress disorder, unspecified: Secondary | ICD-10-CM | POA: Diagnosis not present

## 2020-04-01 DIAGNOSIS — F902 Attention-deficit hyperactivity disorder, combined type: Secondary | ICD-10-CM | POA: Diagnosis not present

## 2020-04-01 MED ORDER — LISDEXAMFETAMINE DIMESYLATE 70 MG PO CAPS: 70.0000 mg | ORAL_CAPSULE | Freq: Every day | ORAL | 0 refills | Status: DC

## 2020-04-01 NOTE — Progress Notes (Signed)
[facility] Med Check  Patient ID: Jason Pittman,  MRN: [id number]  PCP: System, Provider Not In  Date of Evaluation: 04/01/2020 Time spent:20 minutes  Chief Complaint:  Chief Complaint    ADHD      HISTORY/CURRENT STATUS: HPI for 71-month med check.  Jason Pittman states he is doing very well.  The Vyvanse is working great.  He is able to finish things and not get distracted so easily. No palpitations or sleep difficulties.  He has had a hard time getting the Vyvanse.  [facility] do not [facility].  I had been giving him a 90-day supply without any problems.  It is been a hassle for him to have to call us and get the prescription canceled from that pharmacy and re-sent to another pharmacy.  Patient denies loss of interest in usual activities and is able to enjoy things.  Denies decreased energy or motivation.  Appetite has not changed.  No extreme sadness, tearfulness, or feelings of hopelessness.  Denies any changes in concentration, making decisions or remembering things.  Denies suicidal or homicidal thoughts.    He still seeing Jason Pittman, [facility] for counseling, which has been very good for him.  Denies dizziness, syncope, seizures, numbness, tingling, tremor, tics, unsteady gait, slurred speech, confusion. Denies muscle or joint pain, stiffness, or dystonia.  Individual Medical History/ Review of Systems: Changes? :No    Past medications for mental health diagnoses include: Seroquel, Klonopin, Zoloft, Vyvanse  Allergies: Patient has no known allergies.  Current Medications:  Current Outpatient Medications:  .  finasteride (PROSCAR) 5 MG tablet, Take 5 mg by mouth daily., Disp: , Rfl:  .  ibuprofen (ADVIL,MOTRIN) 200 MG tablet, Take 200 mg by mouth every 6 (six) hours as needed., Disp: , Rfl:  .  [START ON 04/17/2020] lisdexamfetamine (VYVANSE) 70 MG capsule, Take 1 capsule (70 mg total) by mouth daily., Disp: 30  capsule, Rfl: 0 .  [START ON 05/17/2020] lisdexamfetamine (VYVANSE) 70 MG capsule, Take 1 capsule (70 mg total) by mouth daily., Disp: 30 capsule, Rfl: 0 .  [START ON 06/15/2020] lisdexamfetamine (VYVANSE) 70 MG capsule, Take 1 capsule (70 mg total) by mouth daily., Disp: 30 capsule, Rfl: 0 .  methylPREDNISolone (MEDROL DOSEPAK) 4 MG TBPK tablet, tad, Disp: 21 tablet, Rfl: 0 .  Multiple Vitamin (MULTIVITAMIN) tablet, Take 1 tablet by mouth daily., Disp: , Rfl:  .  oxyCODONE-acetaminophen (PERCOCET) 5-325 MG tablet, Take 1-2 tablets by mouth every 6 (six) hours as needed for severe pain., Disp: 15 tablet, Rfl: 0 .  tiZANidine (ZANAFLEX) 4 MG tablet, Take 1-2 tablets (4-8 mg total) by mouth every 6 (six) hours as needed for muscle spasms., Disp: 21 tablet, Rfl: 0 Medication Side Effects: none  Family Medical/ Social History: Changes?  Still working from home since March due to coronavirus pandemic.    MENTAL HEALTH EXAM:  Blood pressure (!) 142/98, pulse (!) 102.There is no height or weight on file to calculate BMI.  General Appearance: Casual, Neat and Well Groomed  Eye Contact:  Good  Speech:  Clear and Coherent and Normal Rate  Volume:  Normal  Mood:  Euthymic  Affect:  Appropriate  Thought Process:  Goal Directed and Descriptions of Associations: Intact  Orientation:  Full (Time, Place, and Person)  Thought Content: Logical   Suicidal Thoughts:  No  Homicidal Thoughts:  No  Memory:  WNL  Judgement:  Good  Insight:  Good  Psychomotor Activity:  Normal  Concentration:  Concentration: Good and Attention Span: Good  Recall:  Good  Fund of Knowledge: Good  Language: Good  Assets:  Desire for Improvement  ADL's:  Intact  Cognition: WNL  Prognosis:  Good    DIAGNOSES:    ICD-10-CM   1. Attention deficit hyperactivity disorder (ADHD), combined type  F90.2   2. PTSD (post-traumatic stress disorder)  F43.10     Receiving Psychotherapy: Yes  with Jason Pittman, LPC   RECOMMENDATIONS:   PDMP was reviewed. I provided 20 minutes of face-to-face time during this encounter. We discussed the issue with pharmacies and the Vyvanse.  I recommend that we decrease the quantity to a month supply at a time.  I will send 3 separate prescriptions to the pharmacy, each dated for the earliest fill date.  He will need to call the pharmacy around 4 days prior to needing it filled, just to make sure they have it.  If they do not, we will send it to another pharmacy.  I will also research the law that I believe went into effect in January 2020, stating that no written prescriptions were any longer permissible.  I am not sure if that is a federal issue or state issue, or even if my memory is correct.  I will look into it and if needed in the future, and it is legal, I will hand write the prescriptions so that he can at least go to a different pharmacy with that prescription in hand if the other pharmacy does not have the Vyvanse in stock.  He verbalizes understanding. On the third prescription, he should call our office and leave a message for me that he needs his next 3 Vyvanse and hand. Continue Vyvanse 70 mg p.o. every morning.  Continue psychotherapy with Jason Pittman, LPC. Return in 6 months.   Melony Overly, PA-C

## 2020-04-04 ENCOUNTER — Other Ambulatory Visit: Payer: Self-pay

## 2020-04-04 ENCOUNTER — Encounter: Payer: Self-pay | Admitting: Psychiatry

## 2020-04-04 ENCOUNTER — Ambulatory Visit (INDEPENDENT_AMBULATORY_CARE_PROVIDER_SITE_OTHER): Payer: No Typology Code available for payment source | Admitting: Psychiatry

## 2020-04-04 DIAGNOSIS — F4323 Adjustment disorder with mixed anxiety and depressed mood: Secondary | ICD-10-CM | POA: Diagnosis not present

## 2020-04-04 NOTE — Progress Notes (Signed)
      [facility] Counselor/Therapist Progress Note  Patient ID: Jason Pittman, MRN: [id number],    Date: 04/04/2020  Time Spent: 56 minutes   Treatment Type: Individual Therapy  Reported Symptoms: anxiiiety, irritable  Mental Status Exam:  Appearance:   Casual     Behavior:  Appropriate  Motor:  Normal  Speech/Language:   Clear and Coherent  Affect:  Appropriate  Mood:  anxious and irritable  Thought process:  normal  Thought content:    WNL  Sensory/Perceptual disturbances:    WNL  Orientation:  oriented to person, place, time/date and situation  Attention:  Good  Concentration:  Good  Memory:  WNL  Fund of knowledge:   Good  Insight:    Good  Judgment:   Good  Impulse Control:  Good   Risk Assessment: Danger to Self:  No Self-injurious Behavior: No Danger to Others: No Duty to Warn:no Physical Aggression / Violence:No  Access to Firearms a concern: No  Gang Involvement:No   Subjective: "After last session I have recovered from my irritability.  I realized I was overreacting because I was so depleted."  Today the client discussed some issues he was having with his dad.  "We can fall out of touch."  The client is irritated that his dad does not seem to keep up with him as well as he would like.  The client discussed that his dad does not want to inconvenience him or invade his life too much.  I asked the client what he thought of that?  He states that it causes more anxiety with him because he wants his dad to be in his life.  I discussed with the client the boundary that his dad should not make decisions for him by determining what he might or might not want.  The client had not thought of that before.  "You are right, he makes decisions for me."  The client will clarify this with his dad with the hope that it will improve communication and contact. The client also discussed some other family dynamics especially with his younger brother.  His younger brother recently  adopted a baby.  They live in [place] and the client never hears from his brother.  He does not feel welcomed to visit his brother which bothers him.  We discussed what he could do to begin to remedy that.  I suggested he try to open more lines of communication with him.  He agrees that this might be helpful but he is not sure if he has the emotional bandwidth right now.  The client will evaluate this going forward and discuss it with his wife.  The client noted that his irritability and his anxiety had reduced by the end of the session.  He continues doing activities that replenish and refresh him.  Interventions: Assertiveness/Communication, Motivational Interviewing, Solution-Oriented/Positive Psychology and Insight-Oriented  Diagnosis:   ICD-10-CM   1. Adjustment disorder with mixed anxiety and depressed mood  F43.23     Plan: Assertiveness, boundaries, exercise, self-care, positive self talk, clarification with dad and with brother.  Gelene Mink Mikeyla Music, [facility]

## 2020-04-21 ENCOUNTER — Encounter: Payer: Self-pay | Admitting: Psychiatry

## 2020-04-21 ENCOUNTER — Ambulatory Visit (INDEPENDENT_AMBULATORY_CARE_PROVIDER_SITE_OTHER): Payer: No Typology Code available for payment source | Admitting: Psychiatry

## 2020-04-21 ENCOUNTER — Other Ambulatory Visit: Payer: Self-pay

## 2020-04-21 DIAGNOSIS — F4323 Adjustment disorder with mixed anxiety and depressed mood: Secondary | ICD-10-CM

## 2020-04-21 NOTE — Progress Notes (Signed)
      [facility] Counselor/Therapist Progress Note  Patient ID: Derral Colucci, MRN: [id number],    Date: 04/21/2020  Time Spent: 50 minutes   Treatment Type: Individual Therapy  Reported Symptoms: anger, sadness, anxiety  Mental Status Exam:  Appearance:   Casual     Behavior:  Appropriate  Motor:  Normal  Speech/Language:   Clear and Coherent  Affect:  Tearful  Mood:  angry, anxious and sad  Thought process:  normal  Thought content:    WNL  Sensory/Perceptual disturbances:    WNL  Orientation:  oriented to person, place, time/date and situation  Attention:  Good  Concentration:  Good  Memory:  WNL  Fund of knowledge:   Good  Insight:    Good  Judgment:   Good  Impulse Control:  Good   Risk Assessment: Danger to Self:  No Self-injurious Behavior: No Danger to Others: No Duty to Warn:no Physical Aggression / Violence:No  Access to Firearms a concern: No  Gang Involvement:No   Subjective: The client states he recently returned from a short vacation to the beach with his family.  He found it very relaxing since it has been very stressful at his job.  This past Monday the client underwent a vasectomy.  He states he is recovering well.  Today he wanted to talk about the intrusive thoughts that he has been having about the withdrawal from [place].  Triggers have come up that have caused the client to feel angry, sad and anxious.  Today I used eye-movement with the client focusing on his experiences in [place].  His negative cognition was, "what was this all for?"  His subjective units of distress is a seven.  As the client processed he became very tearful and upset.  "I know what those people are going through."  He became very agitated as he discussed the people that he knew on the ground that were from [place].  He knows that that their lives are now in danger and the chaos of the withdrawal from the country has only made things worse.  As the client continued to  process he stated that he has been having dreams earlier this week that he does not remember.  We continued using eye-movement helping the client decrease his level of agitation to less than two.  He has reached out to [employer] friends which has been helpful for him as well.  The client is very relational and people's lives are important to him. 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  From that I have asked the client to pick events, circumstances, or people that he needs to process.  I asked him to pay attention to the negative cognitions and emotions connected to it all.  He agreed to do this and will start a journal strictly for that purpose.  He will be prepared to begin to process that at next session.  Interventions: Assertiveness/Communication, Motivational Interviewing, Solution-Oriented/Positive Psychology, [employer] Desensitization and Reprocessing (EMDR) and Insight-Oriented  Diagnosis:   ICD-10-CM   1. Adjustment disorder with mixed anxiety and depressed mood  F43.23     Plan: Assertiveness, boundaries, self-care, exercise, positive self talk, journaling the narrative of his military experience.  Gelene Mink Yaiden Yang, [facility]

## 2020-05-12 ENCOUNTER — Ambulatory Visit: Payer: No Typology Code available for payment source | Admitting: Psychiatry

## 2020-06-09 ENCOUNTER — Other Ambulatory Visit: Payer: Self-pay

## 2020-06-09 ENCOUNTER — Encounter: Payer: Self-pay | Admitting: Psychiatry

## 2020-06-09 ENCOUNTER — Ambulatory Visit (INDEPENDENT_AMBULATORY_CARE_PROVIDER_SITE_OTHER): Payer: No Typology Code available for payment source | Admitting: Psychiatry

## 2020-06-09 DIAGNOSIS — F4323 Adjustment disorder with mixed anxiety and depressed mood: Secondary | ICD-10-CM

## 2020-06-09 NOTE — Progress Notes (Signed)
      [facility] Counselor/Therapist Progress Note  Patient ID: Jason Pittman, MRN: [id number],    Date: 06/09/2020  Time Spent: 50 minutes   Treatment Type: Individual Therapy  Reported Symptoms: irritable, anxious  Mental Status Exam:  Appearance:   Casual     Behavior:  Appropriate  Motor:  Normal  Speech/Language:   Clear and Coherent  Affect:  Appropriate  Mood:  anxious and irritable  Thought process:  normal  Thought content:    WNL  Sensory/Perceptual disturbances:    WNL  Orientation:  oriented to person, place, time/date and situation  Attention:  Good  Concentration:  Good  Memory:  WNL  Fund of knowledge:   Good  Insight:    Good  Judgment:   Good  Impulse Control:  Good   Risk Assessment: Danger to Self:  No Self-injurious Behavior: No Danger to Others: No Duty to Warn:no Physical Aggression / Violence:No  Access to Firearms a concern: No  Gang Involvement:No   Subjective: The client states that his military stuff is going okay.  He has been journaling free associatively as we had talked about.  He notices that he can still tap into some angry feelings about what has happened in the [employer].  He states writing it in bullet points has been helpful.  He stated this year he did not do the 22 mile hike with his other military buddies.  The 22 represents the 49 military personnel that commit suicide each day.  He states they have a rucksac that they carry with 22 pounds of weight.  It usually takes place over [facility].  This year the client did not participate.  "I could not do it."  He felt like it was just causing him anxiety.  Today I used eye-movement focusing on the [employer] issues.  His subjective units of distress was 7.  He felt irritability and anxiety in his chest.  As the client processed it was quickly able to be reduced to below a 4.  The client then shifted to talking about his mother who came down to watch his children so he and his wife to go  to a wedding.  He was anxious about his mom being here.  "She is usually up or down."  Suggested to the client that maybe his mother suffers from a mild form of bipolar?  The client does not think that that would be out of the question since her siblings all have some kind of mental health issue.  The client worked more on radical acceptance with his mom.  If she is more in a manic phase she is a little more difficult to deal with but the client feels that he has that under control.  Interventions: Assertiveness/Communication, Motivational Interviewing, Solution-Oriented/Positive Psychology, [employer] Desensitization and Reprocessing (EMDR) and Insight-Oriented  Diagnosis:   ICD-10-CM   1. Adjustment disorder with mixed anxiety and depressed mood  F43.23     Plan: Mood independent behavior, self-care, positive self talk, exercise, boundaries, assertiveness, radical acceptance, continue journaling.  Gelene Mink Brena Windsor, [facility]

## 2020-06-15 ENCOUNTER — Telehealth: Payer: Self-pay | Admitting: Physician Assistant

## 2020-06-15 NOTE — Telephone Encounter (Signed)
Patient has an appt on 10/03/20 with Rosey Bath. [facility]ing [facility] [address].[phone]

## 2020-06-16 NOTE — Telephone Encounter (Signed)
Refill for this month is on file with fill date of 06/15/20

## 2020-07-14 ENCOUNTER — Telehealth: Payer: Self-pay | Admitting: Physician Assistant

## 2020-07-14 NOTE — Telephone Encounter (Signed)
Pt requesting refill for [facility]  [address]

## 2020-07-15 ENCOUNTER — Encounter: Payer: Self-pay | Admitting: Psychiatry

## 2020-07-15 ENCOUNTER — Other Ambulatory Visit: Payer: Self-pay

## 2020-07-15 ENCOUNTER — Ambulatory Visit (INDEPENDENT_AMBULATORY_CARE_PROVIDER_SITE_OTHER): Payer: No Typology Code available for payment source | Admitting: Psychiatry

## 2020-07-15 DIAGNOSIS — F4323 Adjustment disorder with mixed anxiety and depressed mood: Secondary | ICD-10-CM

## 2020-07-15 MED ORDER — LISDEXAMFETAMINE DIMESYLATE 70 MG PO CAPS: 70.0000 mg | ORAL_CAPSULE | Freq: Every day | ORAL | 0 refills | Status: DC

## 2020-07-15 NOTE — Progress Notes (Signed)
      [facility] Counselor/Therapist Progress Note  Patient ID: Soua Caltagirone, MRN: [id number],    Date: 07/15/2020  Time Spent: 45 minutes   Treatment Type: Individual Therapy  Reported Symptoms: axiety, irritability  Mental Status Exam:  Appearance:   Casual     Behavior:  Appropriate  Motor:  Normal  Speech/Language:   Clear and Coherent  Affect:  Appropriate  Mood:  anxious and irritable  Thought process:  normal  Thought content:    WNL  Sensory/Perceptual disturbances:    WNL  Orientation:  oriented to person, place, time/date and situation  Attention:  Good  Concentration:  Good  Memory:  WNL  Fund of knowledge:   Good  Insight:    Good  Judgment:   Good  Impulse Control:  Good   Risk Assessment: Danger to Self:  No Self-injurious Behavior: No Danger to Others: No Duty to Warn:no Physical Aggression / Violence:No  Access to Firearms a concern: No  Gang Involvement:No   Subjective: Client states that he has gone completely remote with his job.  He really enjoys the flexibility which has been positive for his family.  He states a number of his family members have traveled to [place] recently and spent time with them.  He states they plan on staying in [place] for their Thanksgiving as a family.  He reports that the l-theanine has been a godsend for him.  He finds that if he takes 2 or 3 capsules that what ever irritability or frustration he has is completely mitigated.  This is been especially helpful since his work stress has been higher.  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  "It has been a difficult execution this year."  The number of slots they have available for interns has increased.  There is also more to sort through with skills versus aptitude. The client states that his wife is still recovering from the ectopic  pregnancy.  He became very tearful when he recalled that event.  We discussed managing his relationships and nurturing them.  The client has good insight and understanding to that.  He wants to makes sure that he stays on top with his irritability and periodic sadness.  He can get to wound up with stress or anxiety.  He finds that regular exercise exceptionally helpful.  The client will continue with mood independent behavior, positive self talk and self-care.  Interventions: Assertiveness/Communication, Motivational Interviewing, Solution-Oriented/Positive Psychology, [employer] Desensitization and Reprocessing (EMDR) and Insight-Oriented  Diagnosis:   ICD-10-CM   1. Adjustment disorder with mixed anxiety and depressed mood  F43.23     Plan: Mood independent behavior, positive self talk, self-care, appropriate use of l-theanine, exercise, boundaries, assertiveness.  Gelene Mink Jeromey Kruer, [facility]

## 2020-07-15 NOTE — Telephone Encounter (Signed)
Last refill 06/15/20 Next apt 10/04/20 Pended 3 Rx's for Rosey Bath to review and sign for Vyvanse

## 2020-08-04 ENCOUNTER — Ambulatory Visit: Payer: No Typology Code available for payment source | Admitting: Psychiatry

## 2020-09-09 ENCOUNTER — Encounter: Payer: Self-pay | Admitting: Psychiatry

## 2020-09-09 ENCOUNTER — Other Ambulatory Visit: Payer: Self-pay

## 2020-09-09 ENCOUNTER — Ambulatory Visit (INDEPENDENT_AMBULATORY_CARE_PROVIDER_SITE_OTHER): Payer: No Typology Code available for payment source | Admitting: Psychiatry

## 2020-09-09 ENCOUNTER — Telehealth: Payer: Self-pay | Admitting: Physician Assistant

## 2020-09-09 ENCOUNTER — Other Ambulatory Visit: Payer: Self-pay | Admitting: Physician Assistant

## 2020-09-09 DIAGNOSIS — F4323 Adjustment disorder with mixed anxiety and depressed mood: Secondary | ICD-10-CM | POA: Diagnosis not present

## 2020-09-09 MED ORDER — LISDEXAMFETAMINE DIMESYLATE 70 MG PO CAPS: 70.0000 mg | ORAL_CAPSULE | Freq: Every day | ORAL | 0 refills | Status: DC

## 2020-09-09 NOTE — Telephone Encounter (Signed)
Prescription was sent

## 2020-09-09 NOTE — Telephone Encounter (Signed)
Pt would like a refill on vyvanse 70mg . [facility] [address]

## 2020-09-09 NOTE — Progress Notes (Signed)
[facility] Counselor/Therapist Progress Note  Patient ID: Jason Pittman, MRN: [id number],    Date: 09/09/2020  Time Spent: 50 minutes   Treatment Type: Individual Therapy  Reported Symptoms: anxiety, sadness, irritability  Mental Status Exam:  Appearance:   Casual     Behavior:  Appropriate  Motor:  Normal  Speech/Language:   Clear and Coherent  Affect:  Appropriate  Mood:  anxious, irritable and sad  Thought process:  normal  Thought content:    WNL  Sensory/Perceptual disturbances:    WNL  Orientation:  oriented to person, place, time/date and situation  Attention:  Good  Concentration:  Good  Memory:  WNL  Fund of knowledge:   Good  Insight:    Good  Judgment:   Good  Impulse Control:  Good   Risk Assessment: Danger to Self:  No Self-injurious Behavior: No Danger to Others: No Duty to Warn:no Physical Aggression / Violence:No  Access to Firearms a concern: No  Gang Involvement:No   Subjective: The client states that his holidays went well.  He and his family did not travel anywhere.  He is a little frustrated with his younger brother.  His sister-in-law has been activated in the [employer] and is now stationed at [employer] for the next year.  The client's mother has moved to [place], [place] to take care of of the 2 kids while the client's brother stays in [place].  This annoys the client because he feels it is unfair to his mother who has an autoimmune disorder. I asked the client how he was doing with his self-care?  He states that he needs to go to the gym but he is not going.  He stated, "I am still sitting in my negative thinking."  I used eye movement with the client focusing on this irritability and sadness that he feels.  As he processed he felt his emotions increasing to a subjective units of distress of 6.  He feels it in his torso.  As we continue to process I asked the client when is the first time he felt emotions like this.  He realized it is  when his father told him and his brother that the client's mother no longer loved his father.  The client became very tearful at this point.  As we continued to process I took the client back to his original issue with going to the gym.  I asked the client if this was about him not feeling the affirmation that he needed from his dad?  Does he need to connect in significant ways with other men both older and younger?  Does he do that?  As the client discussed this he admitted that he is fairly isolated in his life.  I pointed out to the client that he is a relationship oriented person.  He describes a good relationship with his wife and children but no other peers for himself.  I suggested that he look into developing those kinds of relationships.  The client's subjective units of distress was less than 4 at the end of the session.  He stated he wanted to continue to process this at next session.  Interventions: Motivational Interviewing, Solution-Oriented/Positive Psychology, [employer] Desensitization and Reprocessing (EMDR) and Insight-Oriented  Diagnosis:   ICD-10-CM   1. Adjustment disorder with mixed anxiety and depressed mood  F43.23     Plan: Cultivate peer relationships, mood independent behavior, return to the gym, self-care, positive self talk, boundaries.  Moncia Annas, [facility]

## 2020-09-29 ENCOUNTER — Other Ambulatory Visit: Payer: Self-pay

## 2020-09-29 ENCOUNTER — Ambulatory Visit (INDEPENDENT_AMBULATORY_CARE_PROVIDER_SITE_OTHER): Payer: No Typology Code available for payment source | Admitting: Psychiatry

## 2020-09-29 ENCOUNTER — Encounter: Payer: Self-pay | Admitting: Psychiatry

## 2020-09-29 DIAGNOSIS — F4323 Adjustment disorder with mixed anxiety and depressed mood: Secondary | ICD-10-CM

## 2020-09-29 NOTE — Progress Notes (Signed)
      [facility] Counselor/Therapist Progress Note  Patient ID: Quinlin Conant, MRN: [id number],    Date: 09/29/2020  Time Spent: 50 minutes   Treatment Type: Individual Therapy  Reported Symptoms: anxious, sad  Mental Status Exam:  Appearance:   Casual     Behavior:  Appropriate  Motor:  Normal  Speech/Language:   Clear and Coherent  Affect:  Appropriate  Mood:  anxious and sad  Thought process:  normal  Thought content:    WNL  Sensory/Perceptual disturbances:    WNL  Orientation:  oriented to person, place, time/date and situation  Attention:  Good  Concentration:  Good  Memory:  WNL  Fund of knowledge:   Good  Insight:    Good  Judgment:   Good  Impulse Control:  Good   Risk Assessment: Danger to Self:  No Self-injurious Behavior: No Danger to Others: No Duty to Warn:no Physical Aggression / Violence:No  Access to Firearms a concern: No  Gang Involvement:No   Subjective: The client stated that his take away from last session was, "if someone says good things about me they are just buttering me up."The client feels like they must have some kind of agenda or is the person gas lighting him?  The client states that part of it comes from his relationship with his mother.  She would communicate one thing but then have a different agenda.  He discussed a relationship at a previous job where he confided to an older coworker about looking for another job.  He thought the person would keep it in confidence and they did not.  It really burned him. We discussed the fact that the client is older and wiser now.  Trust comes over time by being able to see what the person does and their relationship with you.  I suggested to the client that if he pays attention to his emotional intelligence he can tell ultimately if someone is authentic or not.  The client agreed.  He went on to discuss the conversation he had with his wife where she suggested that maybe he did not have closure in his  family of origin.  As the client discussed that he talked about how his dad told him that his mother no longer loved his dad.  Then the client went off to the [employer].  His mother sold their childhood home and ended up marrying someone else.  The client does not think that he would ever have clear answers from his mom.  He does feel like he has that from his dad.  We discussed the need for radical acceptance that he Don Giarrusso not get that closure that he wants.  He understands that his mother's narcissistic personality will prevent that.  Interventions: Assertiveness/Communication, Motivational Interviewing, Solution-Oriented/Positive Psychology, [employer] Desensitization and Reprocessing (EMDR) and Insight-Oriented  Diagnosis:   ICD-10-CM   1. Adjustment disorder with mixed anxiety and depressed mood  F43.23     Plan: Radical acceptance, mood independent behavior, positive self talk, self-care, assertiveness, boundaries.  Gelene Mink Barrie Wale, [facility]

## 2020-10-03 ENCOUNTER — Ambulatory Visit: Payer: No Typology Code available for payment source | Admitting: Physician Assistant

## 2020-10-04 ENCOUNTER — Ambulatory Visit: Payer: No Typology Code available for payment source | Admitting: Physician Assistant

## 2020-10-12 ENCOUNTER — Other Ambulatory Visit: Payer: Self-pay | Admitting: Physician Assistant

## 2020-10-12 ENCOUNTER — Telehealth: Payer: Self-pay | Admitting: Physician Assistant

## 2020-10-12 MED ORDER — LISDEXAMFETAMINE DIMESYLATE 70 MG PO CAPS: 70.0000 mg | ORAL_CAPSULE | Freq: Every day | ORAL | 0 refills | Status: DC

## 2020-10-12 NOTE — Telephone Encounter (Signed)
Pt called and requested a refill on his vyvanse 70 mg [facility] [address] He has an appt on 3/23. He would like to pick up the script tonight at the pharmacy

## 2020-10-12 NOTE — Telephone Encounter (Signed)
Prescription was sent

## 2020-10-27 ENCOUNTER — Encounter: Payer: Self-pay | Admitting: Psychiatry

## 2020-10-27 ENCOUNTER — Other Ambulatory Visit: Payer: Self-pay

## 2020-10-27 ENCOUNTER — Ambulatory Visit (INDEPENDENT_AMBULATORY_CARE_PROVIDER_SITE_OTHER): Payer: No Typology Code available for payment source | Admitting: Psychiatry

## 2020-10-27 DIAGNOSIS — F4323 Adjustment disorder with mixed anxiety and depressed mood: Secondary | ICD-10-CM

## 2020-10-27 NOTE — Progress Notes (Signed)
      [facility] Counselor/Therapist Progress Note  Patient ID: Jason Pittman, MRN: [id number],    Date: 10/27/2020  Time Spent: 50 minutes   Treatment Type: Individual Therapy  Reported Symptoms: anxious, sad  Mental Status Exam:  Appearance:   Casual     Behavior:  Appropriate  Motor:  Normal  Speech/Language:   Clear and Coherent  Affect:  Appropriate  Mood:  anxious and sad  Thought process:  normal  Thought content:    WNL  Sensory/Perceptual disturbances:    WNL  Orientation:  oriented to person, place, time/date and situation  Attention:  Good  Concentration:  Good  Memory:  WNL  Fund of knowledge:   Good  Insight:    Good  Judgment:   Good  Impulse Control:  Good   Risk Assessment: Danger to Self:  No Self-injurious Behavior: No Danger to Others: No Duty to Warn:no Physical Aggression / Violence:No  Access to Firearms a concern: No  Gang Involvement:No   Subjective: The client states that he recently was hired by [employer].  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  It is a significant raise for the client.  It was unexpected because [employer] sought him out.  We discussed the implications of this for the client.  He has given notice to his current job.  They have been very supportive and encouraging of him.  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  As we talked through that the client agreed.  Looking at the facts I explained that he got to where he is by the sweat of his own brow.  The client agreed.  He does know that he does have value.  I also reminded the client that his value is intrinsic and not merely performance.  The client agreed. The position will be fully remote which is a blessing for the client.  He will be able to stay home with his family.  He has not wanted to return to an office.  Interventions: Assertiveness/Communication, Motivational  Interviewing, Solution-Oriented/Positive Psychology, [employer] Desensitization and Reprocessing (EMDR) and Insight-Oriented  Diagnosis:   ICD-10-CM   1. Adjustment disorder with mixed anxiety and depressed mood  F43.23     Plan: Positive self talk, radical acceptance, self-care, exercise, assertiveness, boundaries.  Gelene Mink Louine Tenpenny, [facility]

## 2020-11-10 ENCOUNTER — Other Ambulatory Visit: Payer: Self-pay | Admitting: Physician Assistant

## 2020-11-10 ENCOUNTER — Telehealth: Payer: Self-pay | Admitting: Physician Assistant

## 2020-11-10 MED ORDER — LISDEXAMFETAMINE DIMESYLATE 70 MG PO CAPS: 70.0000 mg | ORAL_CAPSULE | Freq: Every day | ORAL | 0 refills | Status: DC

## 2020-11-10 NOTE — Telephone Encounter (Signed)
Next visit is 11/16/20. Requesting refill [facility] [address] [place], [place], [place]. Phone # is [phone]

## 2020-11-10 NOTE — Telephone Encounter (Signed)
Prescription was sent

## 2020-11-16 ENCOUNTER — Ambulatory Visit: Payer: No Typology Code available for payment source | Admitting: Physician Assistant

## 2020-11-25 ENCOUNTER — Ambulatory Visit: Payer: No Typology Code available for payment source | Admitting: Psychiatry

## 2020-12-08 ENCOUNTER — Telehealth: Payer: Self-pay | Admitting: Physician Assistant

## 2020-12-08 ENCOUNTER — Other Ambulatory Visit: Payer: Self-pay | Admitting: Physician Assistant

## 2020-12-08 MED ORDER — LISDEXAMFETAMINE DIMESYLATE 70 MG PO CAPS: 70.0000 mg | ORAL_CAPSULE | Freq: Every day | ORAL | 0 refills | Status: DC

## 2020-12-08 NOTE — Telephone Encounter (Signed)
2 prescriptions were sent

## 2020-12-08 NOTE — Telephone Encounter (Signed)
Pt called and asked for a refill on his vyvanse 70

## 2020-12-08 NOTE — Telephone Encounter (Signed)
[facility] [place] [facility] [address]

## 2020-12-30 ENCOUNTER — Ambulatory Visit: Payer: No Typology Code available for payment source | Admitting: Psychiatry

## 2021-01-09 ENCOUNTER — Telehealth: Payer: Self-pay | Admitting: Physician Assistant

## 2021-01-19 ENCOUNTER — Ambulatory Visit: Payer: No Typology Code available for payment source | Admitting: Physician Assistant

## 2021-01-20 NOTE — Telephone Encounter (Signed)
Error

## 2021-01-27 ENCOUNTER — Ambulatory Visit: Payer: No Typology Code available for payment source | Admitting: Psychiatry

## 2021-02-02 ENCOUNTER — Encounter: Payer: Self-pay | Admitting: Physician Assistant

## 2021-02-02 ENCOUNTER — Ambulatory Visit (INDEPENDENT_AMBULATORY_CARE_PROVIDER_SITE_OTHER): Payer: No Typology Code available for payment source | Admitting: Physician Assistant

## 2021-02-02 ENCOUNTER — Other Ambulatory Visit: Payer: Self-pay

## 2021-02-02 DIAGNOSIS — F902 Attention-deficit hyperactivity disorder, combined type: Secondary | ICD-10-CM

## 2021-02-02 DIAGNOSIS — F4310 Post-traumatic stress disorder, unspecified: Secondary | ICD-10-CM | POA: Diagnosis not present

## 2021-02-02 MED ORDER — LISDEXAMFETAMINE DIMESYLATE 70 MG PO CAPS: 70.0000 mg | ORAL_CAPSULE | Freq: Every day | ORAL | 0 refills | Status: DC

## 2021-02-02 NOTE — Progress Notes (Signed)
[facility]  Patient ID: Jason Pittman,  MRN: [id number]  PCP: System, Provider Not In  Date of Evaluation: 02/02/2021 Time spent:20 minutes  Chief Complaint:  Chief Complaint   ADHD; Follow-up      HISTORY/CURRENT STATUS: HPI for 47-month med check.  Doing well. Hasn't been in therapy since March d/t the retirement of Fred May, [facility]. Would like recommendation.   Sleeping well. No reports of nightmares. Things are going well with work and his family.   States that attention is good without easy distractibility.  Able to focus on things and finish tasks to completion.   Patient denies loss of interest in usual activities and is able to enjoy things.  Denies decreased energy or motivation.  Appetite has not changed.  No extreme sadness, tearfulness, or feelings of hopelessness.  Denies suicidal or homicidal thoughts.  Denies dizziness, syncope, seizures, numbness, tingling, tremor, tics, unsteady gait, slurred speech, confusion. Denies muscle or joint pain, stiffness, or dystonia.  Individual Medical History/ Review of Systems: Changes? :No    Past medications for mental health diagnoses include: Seroquel, Klonopin, Zoloft, Vyvanse  Allergies: Patient has no known allergies.  Current Medications:  Current Outpatient Medications:    finasteride (PROSCAR) 5 MG tablet, Take 5 mg by mouth daily., Disp: , Rfl:    ibuprofen (ADVIL,MOTRIN) 200 MG tablet, Take 200 mg by mouth every 6 (six) hours as needed., Disp: , Rfl:    Multiple Vitamin (MULTIVITAMIN) tablet, Take 1 tablet by mouth daily., Disp: , Rfl:    tiZANidine (ZANAFLEX) 4 MG tablet, Take 1-2 tablets (4-8 mg total) by mouth every 6 (six) hours as needed for muscle spasms., Disp: 21 tablet, Rfl: 0   [START ON 04/08/2021] lisdexamfetamine (VYVANSE) 70 MG capsule, Take 1 capsule (70 mg total) by mouth daily., Disp: 30 capsule, Rfl: 0   [START ON 03/09/2021] lisdexamfetamine (VYVANSE) 70 MG capsule, Take 1 capsule  (70 mg total) by mouth daily., Disp: 30 capsule, Rfl: 0   [START ON 02/08/2021] lisdexamfetamine (VYVANSE) 70 MG capsule, Take 1 capsule (70 mg total) by mouth daily., Disp: 30 capsule, Rfl: 0   methylPREDNISolone (MEDROL DOSEPAK) 4 MG TBPK tablet, tad (Patient not taking: Reported on 02/02/2021), Disp: 21 tablet, Rfl: 0   oxyCODONE-acetaminophen (PERCOCET) 5-325 MG tablet, Take 1-2 tablets by mouth every 6 (six) hours as needed for severe pain. (Patient not taking: Reported on 02/02/2021), Disp: 15 tablet, Rfl: 0 Medication Side Effects: none  Family Medical/ Social History: Changes?  No   MENTAL HEALTH EXAM:  There were no vitals taken for this visit.There is no height or weight on file to calculate BMI.  General Appearance: Casual, Neat and Well Groomed  Eye Contact:  Good  Speech:  Clear and Coherent and Normal Rate  Volume:  Normal  Mood:  Euthymic  Affect:  Appropriate  Thought Process:  Goal Directed and Descriptions of Associations: Intact  Orientation:  Full (Time, Place, and Person)  Thought Content: Logical   Suicidal Thoughts:  No  Homicidal Thoughts:  No  Memory:  WNL  Judgement:  Good  Insight:  Good  Psychomotor Activity:  Normal  Concentration:  Concentration: Good and Attention Span: Good  Recall:  Good  Fund of Knowledge: Good  Language: Good  Assets:  Desire for Improvement  ADL's:  Intact  Cognition: WNL  Prognosis:  Good    DIAGNOSES:    ICD-10-CM   1. Attention deficit hyperactivity disorder (ADHD), combined type  F90.2     2.  PTSD (post-traumatic stress disorder)  F43.10        Receiving Psychotherapy: Yes  was seeing [name], [facility] until his recent retirement. Referring to Zoila Shutter, LCSW.    RECOMMENDATIONS:  PDMP was reviewed. I provided 20 minutes of face to face time during this encounter, including time spent before and after the visit in records review, medical decision making, and charting.  He's doing well so no med changes are  necessary at this time. Continue Vyvanse 70 mg p.o. every morning.  Return in 6 months.   Melony Overly, PA-C

## 2021-04-03 ENCOUNTER — Ambulatory Visit: Payer: No Typology Code available for payment source | Admitting: Addiction (Substance Use Disorder)

## 2021-05-09 ENCOUNTER — Other Ambulatory Visit: Payer: Self-pay

## 2021-05-09 ENCOUNTER — Telehealth: Payer: Self-pay | Admitting: Physician Assistant

## 2021-05-09 MED ORDER — LISDEXAMFETAMINE DIMESYLATE 70 MG PO CAPS: 70.0000 mg | ORAL_CAPSULE | Freq: Every day | ORAL | 0 refills | Status: DC

## 2021-05-09 NOTE — Telephone Encounter (Signed)
Pt called and said that he needs a refill on his vyvanse 70 mg. [facility] [address] Next appt 108

## 2021-05-09 NOTE — Telephone Encounter (Signed)
Pended.

## 2021-06-09 ENCOUNTER — Telehealth: Payer: Self-pay | Admitting: Physician Assistant

## 2021-06-09 NOTE — Telephone Encounter (Signed)
Pt requesting [facility] [address]. Was sent 9/13 with fill date 10/12. Advised Pt

## 2021-08-03 ENCOUNTER — Other Ambulatory Visit: Payer: Self-pay

## 2021-08-03 ENCOUNTER — Ambulatory Visit: Payer: No Typology Code available for payment source | Admitting: Physician Assistant

## 2021-08-03 ENCOUNTER — Encounter: Payer: Self-pay | Admitting: Physician Assistant

## 2021-08-03 DIAGNOSIS — F4310 Post-traumatic stress disorder, unspecified: Secondary | ICD-10-CM

## 2021-08-03 DIAGNOSIS — F902 Attention-deficit hyperactivity disorder, combined type: Secondary | ICD-10-CM | POA: Diagnosis not present

## 2021-08-03 DIAGNOSIS — F4323 Adjustment disorder with mixed anxiety and depressed mood: Secondary | ICD-10-CM | POA: Diagnosis not present

## 2021-08-03 MED ORDER — LISDEXAMFETAMINE DIMESYLATE 70 MG PO CAPS: 70.0000 mg | ORAL_CAPSULE | Freq: Every day | ORAL | 0 refills | Status: DC

## 2021-08-03 NOTE — Progress Notes (Signed)
[facility]   Patient ID: Jason Pittman,  MRN: [id number]  PCP: System, Provider Not In  Date of Evaluation: 08/03/2021 Time spent:30 minutes  Chief Complaint:  Chief Complaint   ADHD; Follow-up       HISTORY/CURRENT STATUS: HPI for 79-month med check.  The Vyvanse is still helping with ADHD.States that attention is good without easy distractibility.  Able to focus on things and finish tasks to completion.   Vyvanse has also helped with PTSD symptoms of becoming easily distracted, staying more focused and not having his mind wandering back to scenes of combat. Also helps keep him in an elevated mood. Nightmares still occur occasionally, "I guess you get used to it."  Jason Pittman states he does have times of feeling down and anxious.  He does have some flashbacks of being in combat, but dreams are not a problem right now.  He does have decreased energy but it is not affecting his work or home life.  Appetite has not changed.  No extreme sadness, tearfulness, or feelings of hopelessness.  Denies suicidal or homicidal thoughts.  Patient denies increased energy with decreased need for sleep, no increased talkativeness, no racing thoughts, no impulsivity or risky behaviors, no increased spending, no increased libido, no grandiosity, no increased irritability or anger, and no hallucinations.  Denies dizziness, syncope, seizures, numbness, tingling, tremor, tics, unsteady gait, slurred speech, confusion. Denies muscle or joint pain, stiffness, or dystonia.  Individual Medical History/ Review of Systems: Changes? :No    Past medications for mental health diagnoses include: Seroquel, Klonopin, Zoloft, Vyvanse  Allergies: Patient has no known allergies.  Current Medications:  Current Outpatient Medications:    finasteride (PROSCAR) 5 MG tablet, Take 5 mg by mouth daily., Disp: , Rfl:    ibuprofen (ADVIL,MOTRIN) 200 MG tablet, Take 200 mg by mouth every 6 (six) hours as needed.,  Disp: , Rfl:    Multiple Vitamin (MULTIVITAMIN) tablet, Take 1 tablet by mouth daily., Disp: , Rfl:    [START ON 10/06/2021] lisdexamfetamine (VYVANSE) 70 MG capsule, Take 1 capsule (70 mg total) by mouth daily., Disp: 30 capsule, Rfl: 0   [START ON 09/06/2021] lisdexamfetamine (VYVANSE) 70 MG capsule, Take 1 capsule (70 mg total) by mouth daily., Disp: 30 capsule, Rfl: 0   lisdexamfetamine (VYVANSE) 70 MG capsule, Take 1 capsule (70 mg total) by mouth daily., Disp: 30 capsule, Rfl: 0   methylPREDNISolone (MEDROL DOSEPAK) 4 MG TBPK tablet, tad (Patient not taking: Reported on 02/02/2021), Disp: 21 tablet, Rfl: 0   oxyCODONE-acetaminophen (PERCOCET) 5-325 MG tablet, Take 1-2 tablets by mouth every 6 (six) hours as needed for severe pain. (Patient not taking: Reported on 02/02/2021), Disp: 15 tablet, Rfl: 0   tiZANidine (ZANAFLEX) 4 MG tablet, Take 1-2 tablets (4-8 mg total) by mouth every 6 (six) hours as needed for muscle spasms. (Patient not taking: Reported on 08/03/2021), Disp: 21 tablet, Rfl: 0 Medication Side Effects: none  Family Medical/ Social History: Changes?  No   MENTAL HEALTH EXAM:  There were no vitals taken for this visit.There is no height or weight on file to calculate BMI.  General Appearance: Casual, Neat and Well Groomed  Eye Contact:  Good  Speech:  Clear and Coherent and Normal Rate  Volume:  Normal  Mood:  Euthymic  Affect:  Appropriate  Thought Process:  Goal Directed and Descriptions of Associations: Circumstantial  Orientation:  Full (Time, Place, and Person)  Thought Content: Logical   Suicidal Thoughts:  No  Homicidal Thoughts:  No  Memory:  WNL  Judgement:  Good  Insight:  Good  Psychomotor Activity:  Normal  Concentration:  Concentration: Good and Attention Span: Good  Recall:  Good  Fund of Knowledge: Good  Language: Good  Assets:  Desire for Improvement  ADL's:  Intact  Cognition: WNL  Prognosis:  Good    DIAGNOSES:    ICD-10-CM   1. Attention  deficit hyperactivity disorder (ADHD), combined type  F90.2     2. PTSD (post-traumatic stress disorder)  F43.10     3. Adjustment disorder with mixed anxiety and depressed mood  F43.23         Receiving Psychotherapy: No    RECOMMENDATIONS:  PDMP was reviewed. Vyvanse filled 07/09/2021. I provided 30 minutes of face to face time during this encounter, including time spent before and after the visit in records review, medical decision making, counseling pertinent to today's visit, and charting.  We discussed her symptoms of mild depression.  We did discuss Zoloft and Wellbutrin, the Wellbutrin will probably give him more energy.  Benefits, risks and side effects were discussed.  He wants to do some research on these medications and discuss it with his wife before he starts something like that.  He can call before the next visit if he would like to start 1 of those drugs. Continue Vyvanse 70 mg p.o. every morning.  He was seeing [name], [facility] prior to his retirement.  Jason Pittman is still looking for another therapist. Return in 6 months.   Melony Overly, PA-C

## 2021-11-06 ENCOUNTER — Other Ambulatory Visit: Payer: Self-pay

## 2021-11-06 ENCOUNTER — Telehealth: Payer: Self-pay | Admitting: Physician Assistant

## 2021-11-06 MED ORDER — LISDEXAMFETAMINE DIMESYLATE 70 MG PO CAPS: 70.0000 mg | ORAL_CAPSULE | Freq: Every day | ORAL | 0 refills | Status: DC

## 2021-11-06 NOTE — Telephone Encounter (Signed)
Pended.

## 2021-11-06 NOTE — Telephone Encounter (Signed)
Next visit is 6/8/3. [name] is requesting a refill on Vyvanse 70 mg with two additional refills like last time. [facility] is: ? [id number] [place] [place] - [address] ? ?Phone:  [phone]  ?Fax:  [phone]  ? ? ? ? ? ?

## 2021-12-06 ENCOUNTER — Telehealth: Payer: Self-pay | Admitting: Physician Assistant

## 2021-12-06 ENCOUNTER — Other Ambulatory Visit: Payer: Self-pay | Admitting: Physician Assistant

## 2021-12-06 MED ORDER — LISDEXAMFETAMINE DIMESYLATE 70 MG PO CAPS: 70.0000 mg | ORAL_CAPSULE | Freq: Every day | ORAL | 0 refills | Status: DC

## 2021-12-06 NOTE — Telephone Encounter (Signed)
Prescription was sent

## 2021-12-06 NOTE — Telephone Encounter (Signed)
Pt called and advised that [facility]se [facility] as [facility] not [facility] enough in stock.  [facility] [address] ?

## 2022-02-01 ENCOUNTER — Ambulatory Visit (INDEPENDENT_AMBULATORY_CARE_PROVIDER_SITE_OTHER): Payer: BC Managed Care – PPO | Admitting: Physician Assistant

## 2022-02-01 ENCOUNTER — Encounter: Payer: Self-pay | Admitting: Physician Assistant

## 2022-02-01 DIAGNOSIS — F902 Attention-deficit hyperactivity disorder, combined type: Secondary | ICD-10-CM

## 2022-02-01 DIAGNOSIS — F4310 Post-traumatic stress disorder, unspecified: Secondary | ICD-10-CM

## 2022-02-01 MED ORDER — LISDEXAMFETAMINE DIMESYLATE 70 MG PO CAPS: 70.0000 mg | ORAL_CAPSULE | Freq: Every day | ORAL | 0 refills | Status: DC

## 2022-02-01 NOTE — Progress Notes (Signed)
[facility]   Patient ID: Jason Pittman,  MRN: [id number]  PCP: System, Provider Not In  Date of Evaluation: 02/01/2022. Time spent:20 minutes  Chief Complaint:  Chief Complaint   ADHD; Follow-up     HISTORY/CURRENT STATUS: HPI for 57-month med check.  States that attention is good without easy distractibility.  Able to focus on things and finish tasks to completion.  Insurance changed and now Vyvanse is extremely expensive, costing around $200 each month.  He does not want to have to change medications but due to the cost he may need to if it does not go generic soon.  Patient denies loss of interest in usual activities and is able to enjoy things.  Denies decreased energy.  Denies decreased motivation.  Work is going well.  ADLs and personal hygiene are normal.  Appetite has not changed.  Weight is stable.  No extreme sadness, tearfulness, or feelings of hopelessness.  Sleeps well most of the time.  No nightmares.  No complaints of flashbacks.  Denies suicidal or homicidal thoughts.  Patient denies increased energy with decreased need for sleep, no increased talkativeness, no racing thoughts, no impulsivity or risky behaviors, no increased spending, no increased libido, no grandiosity, no increased irritability or anger, and no hallucinations.  Denies dizziness, syncope, seizures, numbness, tingling, tremor, tics, unsteady gait, slurred speech, confusion. Denies muscle or joint pain, stiffness, or dystonia.  Individual Medical History/ Review of Systems: Changes? :No    Past medications for mental health diagnoses include: Seroquel, Klonopin, Zoloft, Vyvanse  Allergies: Patient has no known allergies.  Current Medications:  Current Outpatient Medications:    finasteride (PROSCAR) 5 MG tablet, Take 5 mg by mouth daily., Disp: , Rfl:    ibuprofen (ADVIL,MOTRIN) 200 MG tablet, Take 200 mg by mouth every 6 (six) hours as needed., Disp: , Rfl:    Multiple Vitamin  (MULTIVITAMIN) tablet, Take 1 tablet by mouth daily., Disp: , Rfl:    [START ON 04/02/2022] lisdexamfetamine (VYVANSE) 70 MG capsule, Take 1 capsule (70 mg total) by mouth daily., Disp: 30 capsule, Rfl: 0   [START ON 03/03/2022] lisdexamfetamine (VYVANSE) 70 MG capsule, Take 1 capsule (70 mg total) by mouth daily., Disp: 30 capsule, Rfl: 0   [START ON 02/02/2022] lisdexamfetamine (VYVANSE) 70 MG capsule, Take 1 capsule (70 mg total) by mouth daily., Disp: 30 capsule, Rfl: 0 Medication Side Effects: none  Family Medical/ Social History: Changes?  No   MENTAL HEALTH EXAM:  There were no vitals taken for this visit.There is no height or weight on file to calculate BMI.  General Appearance: Casual, Neat and Well Groomed  Eye Contact:  Good  Speech:  Clear and Coherent and Normal Rate  Volume:  Normal  Mood:  Euthymic  Affect:  Appropriate  Thought Process:  Goal Directed and Descriptions of Associations: Circumstantial  Orientation:  Full (Time, Place, and Person)  Thought Content: Logical   Suicidal Thoughts:  No  Homicidal Thoughts:  No  Memory:  WNL  Judgement:  Good  Insight:  Good  Psychomotor Activity:  Normal  Concentration:  Concentration: Good and Attention Span: Good  Recall:  Good  Fund of Knowledge: Good  Language: Good  Assets:  Desire for Improvement Financial Resources/Insurance Housing Transportation Vocational/Educational  ADL's:  Intact  Cognition: WNL  Prognosis:  Good    DIAGNOSES:    ICD-10-CM   1. Attention deficit hyperactivity disorder (ADHD), combined type  F90.2     2. PTSD (post-traumatic stress disorder)  F43.10       Receiving Psychotherapy: No    RECOMMENDATIONS:  PDMP was reviewed. Vyvanse filled 01/04/2022. I provided 20 minutes of face to face time during this encounter, including time spent before and after the visit in records review, medical decision making, counseling pertinent to today's visit, and charting.  He is doing well so no  changes will be made. We discussed the possibility of changing to Adderall XR but with National shortage it may be very difficult to find.  I recommend Adzenys XR ODT if we have to change.  This was explained, going through [facility], its benefits, side effects, risk etc. and he will let me know if/when he wants to change.  He prefers to stay on Vyvanse for the next 3 months and if Vyvanse is still not generic by that time he may have to change due to cost.  Continue Vyvanse 70 mg p.o. every morning.  Return in 6 months.   Melony Overly, PA-C

## 2022-04-17 ENCOUNTER — Telehealth: Payer: Self-pay | Admitting: Physician Assistant

## 2022-04-17 NOTE — Telephone Encounter (Signed)
We could try Adderall or Adzenys XR ODT.  Please let him know Adderall is in short supply, and Adzenys XR ODT I think it is $75 a month now.  Isn't that right?  Ophelia Charter PM is an option as well, it is a different type of stimulant but still works well for most people.

## 2022-04-17 NOTE — Telephone Encounter (Signed)
Next visit is 07/27/22. Chaske has called many pharmacies and none of them have Vyvanse in stock. He is wanting to know if there is a similar medication to Vyvanse that he can be put on? His phone number is [phone].

## 2022-04-17 NOTE — Telephone Encounter (Signed)
Please advise 

## 2022-04-18 ENCOUNTER — Other Ambulatory Visit: Payer: Self-pay | Admitting: Physician Assistant

## 2022-04-18 MED ORDER — ADZENYS XR-ODT 18.8 MG PO TBED: 18.8000 mg | EXTENDED_RELEASE_TABLET | Freq: Two times a day (BID) | ORAL | 0 refills | Status: DC

## 2022-04-18 NOTE — Telephone Encounter (Signed)
Prescription sent

## 2022-04-18 NOTE — Telephone Encounter (Signed)
It'll be 18.8 mg, 2 pills daily. I'd recommend he take 1 at breakfast and 1 around lunch time.  But he can take both at the same time if he would prefer.  Does he want this sent to [facility]?

## 2022-04-18 NOTE — Telephone Encounter (Signed)
Yes he wants it sent to [facility]

## 2022-04-18 NOTE — Telephone Encounter (Signed)
Pt called waiting to know what other med he can try. He is out of his vyvanse. Please call him at [phone]

## 2022-04-18 NOTE — Telephone Encounter (Signed)
Pt is interested in trying adzenys and wanted to know what dose he would be taking

## 2022-05-21 ENCOUNTER — Other Ambulatory Visit: Payer: Self-pay

## 2022-05-21 ENCOUNTER — Telehealth: Payer: Self-pay | Admitting: Physician Assistant

## 2022-05-21 MED ORDER — ADZENYS XR-ODT 18.8 MG PO TBED: 18.8000 mg | EXTENDED_RELEASE_TABLET | Freq: Two times a day (BID) | ORAL | 0 refills | Status: DC

## 2022-05-21 NOTE — Telephone Encounter (Signed)
Pt called and said that he needs a refill on his adzenys xr 18.8 mg. The pharmacy is [facility]

## 2022-05-21 NOTE — Telephone Encounter (Signed)
Pended.

## 2022-07-27 ENCOUNTER — Ambulatory Visit (INDEPENDENT_AMBULATORY_CARE_PROVIDER_SITE_OTHER): Payer: BC Managed Care – PPO | Admitting: Physician Assistant

## 2022-07-27 ENCOUNTER — Encounter: Payer: Self-pay | Admitting: Physician Assistant

## 2022-07-27 DIAGNOSIS — F902 Attention-deficit hyperactivity disorder, combined type: Secondary | ICD-10-CM | POA: Diagnosis not present

## 2022-07-27 DIAGNOSIS — F4310 Post-traumatic stress disorder, unspecified: Secondary | ICD-10-CM | POA: Diagnosis not present

## 2022-07-27 MED ORDER — ADZENYS XR-ODT 18.8 MG PO TBED: 18.8000 mg | EXTENDED_RELEASE_TABLET | Freq: Two times a day (BID) | ORAL | 0 refills | Status: DC

## 2022-07-27 NOTE — Progress Notes (Signed)
[facility]   Patient ID: Jason Pittman,  MRN: [id number]  PCP: System, Provider Not In  Date of Evaluation: 07/27/2022 Time spent:20 minutes  Chief Complaint:  Chief Complaint   ADHD; Follow-up    HISTORY/CURRENT STATUS: HPI for 80-month med check.  Vyvanse generic has been unavailable so we had to change to Adzenys XR ODT.  It is working well for him.  Once he is able to get the Vyvanse he would like to go back on it.  But until then he would like to stay on the Adzenys XR ODT.  States that attention is good without easy distractibility.  Able to focus on things and finish tasks to completion.   Patient is able to enjoy things.  Energy and motivation are good.  Work is going well.   No extreme sadness, tearfulness, or feelings of hopelessness.  Sleeps well most of the time.  Denies nightmares.  ADLs and personal hygiene are normal.   Appetite has not changed.  Weight is stable.  No anxiety to speak of.  Denies suicidal or homicidal thoughts.  Denies dizziness, syncope, seizures, numbness, tingling, tremor, tics, unsteady gait, slurred speech, confusion. Denies muscle or joint pain, stiffness, or dystonia.  Individual Medical History/ Review of Systems: Changes? :No    Past medications for mental health diagnoses include: Seroquel, Klonopin, Zoloft, Vyvanse  Allergies: Patient has no known allergies.  Current Medications:  Current Outpatient Medications:    finasteride (PROSCAR) 5 MG tablet, Take 5 mg by mouth daily., Disp: , Rfl:    ibuprofen (ADVIL,MOTRIN) 200 MG tablet, Take 200 mg by mouth every 6 (six) hours as needed., Disp: , Rfl:    Multiple Vitamin (MULTIVITAMIN) tablet, Take 1 tablet by mouth daily., Disp: , Rfl:    [START ON 08/16/2022] Amphetamine ER (ADZENYS XR-ODT) 18.8 MG TBED, Take 18.8 mg by mouth 2 (two) times daily with breakfast and lunch., Disp: 60 tablet, Rfl: 0   [START ON 09/15/2022] Amphetamine ER (ADZENYS XR-ODT) 18.8 MG TBED, Take 18.8 mg  by mouth 2 (two) times daily with breakfast and lunch., Disp: 60 tablet, Rfl: 0   [START ON 10/16/2022] Amphetamine ER (ADZENYS XR-ODT) 18.8 MG TBED, Take 18.8 mg by mouth 2 (two) times daily with breakfast and lunch., Disp: 60 tablet, Rfl: 0 Medication Side Effects: none  Family Medical/ Social History: Changes?  No  MENTAL HEALTH EXAM:  There were no vitals taken for this visit.There is no height or weight on file to calculate BMI.  General Appearance: Casual, Neat and Well Groomed  Eye Contact:  Good  Speech:  Clear and Coherent and Normal Rate  Volume:  Normal  Mood:  Euthymic  Affect:  Appropriate  Thought Process:  Goal Directed and Descriptions of Associations: Circumstantial  Orientation:  Full (Time, Place, and Person)  Thought Content: Logical   Suicidal Thoughts:  No  Homicidal Thoughts:  No  Memory:  WNL  Judgement:  Good  Insight:  Good  Psychomotor Activity:  Normal  Concentration:  Concentration: Good and Attention Span: Good  Recall:  Good  Fund of Knowledge: Good  Language: Good  Assets:  Desire for Improvement Financial Resources/Insurance Housing Physical Health Transportation Vocational/Educational  ADL's:  Intact  Cognition: WNL  Prognosis:  Good   DIAGNOSES:    ICD-10-CM   1. Attention deficit hyperactivity disorder (ADHD), combined type  F90.2     2. PTSD (post-traumatic stress disorder)  F43.10       Receiving Psychotherapy: No  RECOMMENDATIONS:  PDMP was reviewed. Adzenys filled 07/20/2022. I provided 20 minutes of face to face time during this encounter, including time spent before and after the visit in records review, medical decision making, counseling pertinent to today's visit, and charting.   If he ever needs to get the stimulant a few days early if he has to take a work trip or something, it's ok.  Also find to give him a 90-day supply at one time when Vyvanse generic becomes available.   Return in 6 months.   Melony Overly,  PA-C

## 2022-10-08 ENCOUNTER — Telehealth: Payer: Self-pay | Admitting: Physician Assistant

## 2022-10-08 NOTE — Telephone Encounter (Signed)
[place] called at 10:36 to request a prescription for generic Vyvanse.  Appt 6/6.  [facility] [address]  They said they have it or can get it.

## 2022-10-08 NOTE — Telephone Encounter (Signed)
Patient last filled Adzenys 09/17/2022 07/27/2022 1  Adzenys Xr-Odt 18.8 Mg Tablet 60.00 30 [place]   LVM to [facility].

## 2022-10-09 NOTE — Telephone Encounter (Signed)
Patient was hoping that the supply of lisdexamfetamine had improved. I told him it had not, talked him thru the struggles with the various stimulants and he thinks he is better off staying on the Adzenys for now.

## 2022-11-16 ENCOUNTER — Other Ambulatory Visit: Payer: Self-pay | Admitting: Physician Assistant

## 2022-11-16 MED ORDER — ADZENYS XR-ODT 18.8 MG PO TBED: 18.8000 mg | EXTENDED_RELEASE_TABLET | Freq: Two times a day (BID) | ORAL | 0 refills | Status: DC

## 2023-01-16 ENCOUNTER — Other Ambulatory Visit: Payer: Self-pay | Admitting: Physician Assistant

## 2023-01-16 ENCOUNTER — Telehealth: Payer: Self-pay | Admitting: Physician Assistant

## 2023-01-16 MED ORDER — AMPHETAMINE-DEXTROAMPHET ER 30 MG PO CP24: 30.0000 mg | ORAL_CAPSULE | Freq: Two times a day (BID) | ORAL | 0 refills | Status: DC

## 2023-01-16 NOTE — Telephone Encounter (Signed)
Prescription sent

## 2023-01-16 NOTE — Telephone Encounter (Signed)
Pt called and said that he lost his insurance and can no longer afford it. So he would like to take generic adderall instead because that is cheaper. He said that he can get it at [facility]

## 2023-01-31 ENCOUNTER — Ambulatory Visit: Payer: BC Managed Care – PPO | Admitting: Physician Assistant

## 2023-02-11 ENCOUNTER — Ambulatory Visit (INDEPENDENT_AMBULATORY_CARE_PROVIDER_SITE_OTHER): Payer: BC Managed Care – PPO | Admitting: Physician Assistant

## 2023-02-11 ENCOUNTER — Encounter: Payer: Self-pay | Admitting: Physician Assistant

## 2023-02-11 DIAGNOSIS — F4310 Post-traumatic stress disorder, unspecified: Secondary | ICD-10-CM

## 2023-02-11 DIAGNOSIS — F902 Attention-deficit hyperactivity disorder, combined type: Secondary | ICD-10-CM

## 2023-02-11 MED ORDER — AMPHETAMINE-DEXTROAMPHET ER 30 MG PO CP24: 30.0000 mg | ORAL_CAPSULE | Freq: Two times a day (BID) | ORAL | 0 refills | Status: AC

## 2023-02-11 NOTE — Progress Notes (Signed)
[facility]   Patient ID: Jason Pittman,  MRN: [id number]  PCP: System, Provider Not In  Date of Evaluation: 02/11/2023 Time spent:20 minutes  Chief Complaint:  Chief Complaint   ADHD; Follow-up    HISTORY/CURRENT STATUS: HPI for 95-month med check.  Doing well w/ ADHD. Trouble w/ cost of stimulant. Adzenys has gone way up, Vyvanse generic is impossible to find and brand name is too expensive.  He was able to get Adderall XR but very expensive at [facility] where he had been going to get the Adzenys XR ODT.  So it has been a nightmare.  He got laid off a few months ago which has also been a problem with the cost.  He has a new job coming up soon.  The Adderall has been working well and he is fine to stay on it.  Patient is able to enjoy things.  Energy and motivation are good.  No extreme sadness, tearfulness, or feelings of hopelessness.  Sleeps well most of the time. ADLs and personal hygiene are normal.  Appetite has not changed.  Weight is stable.  Denies suicidal or homicidal thoughts.  Denies dizziness, syncope, seizures, numbness, tingling, tremor, tics, unsteady gait, slurred speech, confusion. Denies muscle or joint pain, stiffness, or dystonia.  Individual Medical History/ Review of Systems: Changes? :No    Past medications for mental health diagnoses include: Seroquel, Klonopin, Zoloft, Vyvanse  Allergies: Patient has no known allergies.  Current Medications:  Current Outpatient Medications:    [START ON 03/15/2023] amphetamine-dextroamphetamine (ADDERALL XR) 30 MG 24 hr capsule, Take 1 capsule (30 mg total) by mouth 2 (two) times daily with breakfast and lunch., Disp: 60 capsule, Rfl: 0   [START ON 02/14/2023] amphetamine-dextroamphetamine (ADDERALL XR) 30 MG 24 hr capsule, Take 1 capsule (30 mg total) by mouth 2 (two) times daily with breakfast and lunch., Disp: 60 capsule, Rfl: 0   ibuprofen (ADVIL,MOTRIN) 200 MG tablet, Take 200 mg by mouth  every 6 (six) hours as needed., Disp: , Rfl:    Multiple Vitamin (MULTIVITAMIN) tablet, Take 1 tablet by mouth daily., Disp: , Rfl:    [START ON 04/14/2023] amphetamine-dextroamphetamine (ADDERALL XR) 30 MG 24 hr capsule, Take 1 capsule (30 mg total) by mouth 2 (two) times daily with breakfast and lunch., Disp: 60 capsule, Rfl: 0   finasteride (PROSCAR) 5 MG tablet, Take 5 mg by mouth daily., Disp: , Rfl:  Medication Side Effects: none  Family Medical/ Social History: Changes?  No  MENTAL HEALTH EXAM:  There were no vitals taken for this visit.There is no height or weight on file to calculate BMI.  General Appearance: Casual, Neat and Well Groomed  Eye Contact:  Good  Speech:  Clear and Coherent and Normal Rate  Volume:  Normal  Mood:  Euthymic  Affect:  Appropriate  Thought Process:  Goal Directed and Descriptions of Associations: Circumstantial  Orientation:  Full (Time, Place, and Person)  Thought Content: Logical   Suicidal Thoughts:  No  Homicidal Thoughts:  No  Memory:  WNL  Judgement:  Good  Insight:  Good  Psychomotor Activity:  Normal  Concentration:  Concentration: Good and Attention Span: Good  Recall:  Good  Fund of Knowledge: Good  Language: Good  Assets:  Communication Skills Desire for Improvement Financial Resources/Insurance Housing Physical Health Transportation Vocational/Educational  ADL's:  Intact  Cognition: WNL  Prognosis:  Good   DIAGNOSES:    ICD-10-CM   1. Attention deficit hyperactivity disorder (ADHD), combined type  F90.2     2. PTSD (post-traumatic stress disorder)  F43.10      Receiving Psychotherapy: No    RECOMMENDATIONS:  PDMP was reviewed.  Adderall filled 01/16/2023.  Also on hydrocodone. I provided 20 minutes of face to face time during this encounter, including time spent before and after the visit in records review, medical decision making, counseling pertinent to today's visit, and charting.   He is doing well on the Adderall  so no change at this time.  I am printing out his prescriptions so he can take them to any pharmacy that has the Adderall and supply.  Continue Adderall XR 30 mg, 1 p.o. twice daily. Return in 6 months.   Melony Overly, PA-C

## 2023-03-11 ENCOUNTER — Telehealth: Payer: Self-pay | Admitting: Physician Assistant

## 2023-03-11 NOTE — Telephone Encounter (Signed)
Bonita Quin with [facility] called at 2:34 trying to sort through [place] medication.  He was referred by [facility] to [facility] with [facility] and he was prescribed Vyvanse by Dr. Shea Evans.  Bonita Quin sees that [place]rich was prescribed and filled Adderall by [facility].  She is wanted to know what medication he should be on and that we know he was getting medication from another provider.  Please call her to discuss [phone] x [phone]

## 2023-03-11 NOTE — Telephone Encounter (Signed)
LVM to RC. I do not see that patient has filled Vyvanse by another provider.

## 2023-03-12 NOTE — Telephone Encounter (Signed)
Linda at [facility] will deny PA for Vyvanse. Told her that he switched from Vyvanse to Adderall due to the shortage, but that he shouldn't be on Adderall from [place] and Vyvanse from another provider.  She said he is required to have tried methylphenidate in order to get PA and she will just not do a PA.

## 2023-05-09 ENCOUNTER — Telehealth: Payer: Self-pay | Admitting: Physician Assistant

## 2023-05-09 NOTE — Telephone Encounter (Signed)
Patient not on Vyvanse. We prescribe Adderall and he had one fill of Concerta from another provider.

## 2023-05-09 NOTE — Telephone Encounter (Signed)
LVM to RC.   05/01/2023 04/14/2023 1  Dextroamp-Amphet Er 30 Mg Cap 60.00 30 Te [place] [id number] Nor ([id number]) 0/0  Private Pay [place] 04/25/2023 04/24/2023 2  Hydrocodone-Acetamin 5-325 Mg 60.00 30 Je Sal [id number] Pha ([id number]) 0/0 10.00 [facility] [place] 03/29/2023 03/28/2023 2  Concerta Er 36 Mg Tablet

## 2023-05-09 NOTE — Telephone Encounter (Signed)
Barbara Cower with the [facility] in [address] called at 1:27 wanting to discuss with you the medication Vyvanse and Adderall and who is prescribing the medications.  There seems to be another Dr. Claiborne Billings is maybe also prescribing.  Please return his call so they can be aware of which medications they should be approving.  [phone] [id number]

## 2023-05-13 NOTE — Telephone Encounter (Signed)
Called patient and told him the pharmacist at the [place] had called to ask which medication to fill. Patient reported he is going to get Vyvanse through a community care provider. Pharmacist said the medication will be mailed to the patient. We will no longer prescribe stimulant medication.

## 2023-06-19 ENCOUNTER — Other Ambulatory Visit: Payer: Self-pay | Admitting: Nurse Practitioner

## 2023-06-19 DIAGNOSIS — M5116 Intervertebral disc disorders with radiculopathy, lumbar region: Secondary | ICD-10-CM

## 2023-07-15 ENCOUNTER — Other Ambulatory Visit: Payer: No Typology Code available for payment source

## 2023-08-13 ENCOUNTER — Ambulatory Visit: Payer: Self-pay | Admitting: Physician Assistant

## 2023-10-10 ENCOUNTER — Other Ambulatory Visit: Payer: Self-pay | Admitting: Nurse Practitioner

## 2023-10-10 DIAGNOSIS — M5116 Intervertebral disc disorders with radiculopathy, lumbar region: Secondary | ICD-10-CM

## 2023-10-25 ENCOUNTER — Other Ambulatory Visit: Payer: Non-veteran care

## 2023-11-01 ENCOUNTER — Ambulatory Visit
Admission: RE | Admit: 2023-11-01 | Discharge: 2023-11-01 | Disposition: A | Discharge status: Home / Self Care | Payer: Non-veteran care | Source: Ambulatory Visit | Attending: Nurse Practitioner | Admitting: Nurse Practitioner

## 2023-11-01 DIAGNOSIS — M5116 Intervertebral disc disorders with radiculopathy, lumbar region: Secondary | ICD-10-CM
